# Patient Record
Sex: Female | Born: 1995 | Race: White | Hispanic: No | Marital: Single
Health system: Southern US, Community
[De-identification: ages and names within clinical notes are randomized; demographics above are authoritative.]

## PROBLEM LIST (undated history)

## (undated) DIAGNOSIS — Z6841 Body Mass Index (BMI) 40.0 and over, adult: Secondary | ICD-10-CM

## (undated) DIAGNOSIS — F988 Other specified behavioral and emotional disorders with onset usually occurring in childhood and adolescence: Secondary | ICD-10-CM

## (undated) DIAGNOSIS — F32A Depression, unspecified: Secondary | ICD-10-CM

## (undated) DIAGNOSIS — O139 Gestational [pregnancy-induced] hypertension without significant proteinuria, unspecified trimester: Secondary | ICD-10-CM

## (undated) DIAGNOSIS — E079 Disorder of thyroid, unspecified: Secondary | ICD-10-CM

## (undated) DIAGNOSIS — F329 Major depressive disorder, single episode, unspecified: Secondary | ICD-10-CM

## (undated) HISTORY — DX: Other specified behavioral and emotional disorders with onset usually occurring in childhood and adolescence: F98.8

## (undated) HISTORY — DX: Depression, unspecified: F32.A

## (undated) HISTORY — DX: Gestational (pregnancy-induced) hypertension without significant proteinuria, unspecified trimester: O13.9

## (undated) HISTORY — DX: Body Mass Index (BMI) 40.0 and over, adult: Z684

## (undated) HISTORY — DX: Major depressive disorder, single episode, unspecified: F32.9

## (undated) HISTORY — DX: Disorder of thyroid, unspecified: E07.9

## (undated) HISTORY — PX: EYE SURGERY: SHX253

## (undated) HISTORY — PX: TUMOR REMOVAL: SHX12

---
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
## 2017-01-01 ENCOUNTER — Inpatient Hospital Stay (HOSPITAL_COMMUNITY): Payer: Medicaid Other | Admitting: Anesthesiology

## 2017-01-01 LAB — CBC
HCT: 30.2 % — ABNORMAL LOW (ref 36.0–46.0)
Hemoglobin: 10.1 g/dL — ABNORMAL LOW (ref 12.0–15.0)
MCH: 25.6 pg — ABNORMAL LOW (ref 26.0–34.0)
MCHC: 33.4 g/dL (ref 30.0–36.0)
MCV: 76.5 fL — ABNORMAL LOW (ref 78.0–100.0)
PLATELETS: 315 10*3/uL (ref 150–400)
RBC: 3.95 MIL/uL (ref 3.87–5.11)
RDW: 14.2 % (ref 11.5–15.5)
WBC: 13.7 10*3/uL — AB (ref 4.0–10.5)

## 2017-01-01 LAB — RPR: RPR: NONREACTIVE

## 2017-01-01 MED ORDER — LACTATED RINGERS IV SOLN: 500.0000 mL | Freq: Once | INTRAVENOUS | Status: DC

## 2017-01-01 MED ORDER — EPHEDRINE 5 MG/ML INJ
10.0000 mg | INTRAVENOUS | Status: DC | PRN
  Filled 2017-01-01: qty 4

## 2017-01-01 MED ORDER — OXYTOCIN 40 UNITS IN LACTATED RINGERS INFUSION - SIMPLE MED
1.0000 m[IU]/min | INTRAVENOUS | Status: DC
  Administered 2017-01-01: 2 m[IU]/min via INTRAVENOUS
  Administered 2017-01-02: 28 m[IU]/min via INTRAVENOUS
  Filled 2017-01-01 (×2): qty 1000

## 2017-01-01 MED ORDER — HYDROXYZINE HCL 50 MG PO TABS
50.0000 mg | ORAL_TABLET | Freq: Three times a day (TID) | ORAL | Status: DC | PRN
  Filled 2017-01-01: qty 1

## 2017-01-01 MED ORDER — LABETALOL HCL 5 MG/ML IV SOLN: 20.0000 mg | INTRAVENOUS | Status: DC | PRN

## 2017-01-01 MED ORDER — LIDOCAINE HCL (PF) 1 % IJ SOLN
INTRAMUSCULAR | Status: DC | PRN
  Administered 2017-01-01: 4 mL
  Administered 2017-01-01: 6 mL via EPIDURAL

## 2017-01-01 MED ORDER — FENTANYL 2.5 MCG/ML BUPIVACAINE 1/10 % EPIDURAL INFUSION (WH - ANES)
14.0000 mL/h | INTRAMUSCULAR | Status: DC | PRN
  Administered 2017-01-01 – 2017-01-02 (×5): 14 mL/h via EPIDURAL
  Filled 2017-01-01 (×5): qty 100

## 2017-01-01 MED ORDER — PHENYLEPHRINE 40 MCG/ML (10ML) SYRINGE FOR IV PUSH (FOR BLOOD PRESSURE SUPPORT)
80.0000 ug | PREFILLED_SYRINGE | INTRAVENOUS | Status: DC | PRN
  Filled 2017-01-01: qty 5

## 2017-01-01 MED ORDER — DIPHENHYDRAMINE HCL 50 MG/ML IJ SOLN: 12.5000 mg | INTRAMUSCULAR | Status: DC | PRN

## 2017-01-01 MED ORDER — TERBUTALINE SULFATE 1 MG/ML IJ SOLN
0.2500 mg | Freq: Once | INTRAMUSCULAR | Status: DC | PRN
  Filled 2017-01-01: qty 1

## 2017-01-01 MED ORDER — PHENYLEPHRINE 40 MCG/ML (10ML) SYRINGE FOR IV PUSH (FOR BLOOD PRESSURE SUPPORT)
80.0000 ug | PREFILLED_SYRINGE | INTRAVENOUS | Status: DC | PRN
  Filled 2017-01-01: qty 10
  Filled 2017-01-01: qty 5

## 2017-01-01 MED ORDER — HYDRALAZINE HCL 20 MG/ML IJ SOLN: 10.0000 mg | Freq: Once | INTRAMUSCULAR | Status: DC | PRN

## 2017-01-01 NOTE — Progress Notes (Signed)
Patient ID: Erika Briggs, female   DOB: [birth year], 21 y.o.   MRN: [id number] Doing well, comfortable  Vitals:   01/01/17 0733 01/01/17 0851 01/01/17 1030 01/01/17 1033  BP: (!) 155/98 (!) 145/100 (!) 165/104 (!) 144/97  Pulse: 92 (!) 101 92 84  Resp: 18 18  18   Temp: 98.8 F (37.1 C)     TempSrc: Oral     Weight:      Height:       FHR reactive UCs every 2 min  Dilation: 2 Effacement (%): 60 Cervical Position: Posterior Station: -2 Presentation: Vertex Exam by:: stone rnc  Has foley,will observe for now since she is contracting so much

## 2017-01-01 NOTE — Anesthesia Procedure Notes (Signed)
Epidural Patient location during procedure: OB  Staffing Anesthesiologist: Adrien Shankar  Preanesthetic Checklist Completed: patient identified, site marked, surgical consent, pre-op evaluation, timeout performed, IV checked, risks and benefits discussed and monitors and equipment checked  Epidural Patient position: sitting Prep: site prepped and draped and DuraPrep Patient monitoring: continuous pulse ox and blood pressure Approach: midline Location: L3-L4 Injection technique: LOR air  Needle:  Needle type: Tuohy  Needle gauge: 17 G Needle length: 9 cm and 9 Needle insertion depth: 7 cm Catheter type: closed end flexible Catheter size: 19 Gauge Catheter at skin depth: 14 cm Test dose: negative  Assessment Events: blood not aspirated, injection not painful, no injection resistance, negative IV test and no paresthesia  Additional Notes Dosing of Epidural:  1st dose, through catheter .............................................  Xylocaine 40 mg  2nd dose, through catheter, after waiting 3 minutes.........Xylocaine 60 mg    As each dose occurred, patient was free of IV sx; and patient exhibited no evidence of SA injection.  Patient is more comfortable after epidural dosed. Please see RN's note for documentation of vital signs,and FHR which are stable.  Patient reminded not to try to ambulate with numb legs, and that an RN must be present when she attempts to get up.        

## 2017-01-01 NOTE — Anesthesia Preprocedure Evaluation (Addendum)
Anesthesia Evaluation  Patient identified by MRN, date of birth, ID band Patient awake    Reviewed: Allergy & Precautions, H&P , Patient's Chart, lab work & pertinent test results  Airway Mallampati: III  TM Distance: >3 FB Neck ROM: full    Dental  (+) Teeth Intact   Pulmonary    breath sounds clear to auscultation       Cardiovascular hypertension,  Rhythm:regular Rate:Normal     Neuro/Psych    GI/Hepatic   Endo/Other    Renal/GU      Musculoskeletal   Abdominal   Peds  Hematology   Anesthesia Other Findings   PIH MO    Reproductive/Obstetrics (+) Pregnancy                            Anesthesia Physical Anesthesia Plan  ASA: III  Anesthesia Plan: Epidural   Post-op Pain Management:    Induction:   Airway Management Planned:   Additional Equipment:   Intra-op Plan:   Post-operative Plan:   Informed Consent: I have reviewed the patients History and Physical, chart, labs and discussed the procedure including the risks, benefits and alternatives for the proposed anesthesia with the patient or authorized representative who has indicated his/her understanding and acceptance.   Dental Advisory Given  Plan Discussed with:   Anesthesia Plan Comments: (Labs checked- platelets confirmed with RN in room. Fetal heart tracing, per RN, reported to be stable enough for sitting procedure. Discussed epidural, and patient consents to the procedure:  included risk of possible headache,backache, failed block, allergic reaction, and nerve injury. This patient was asked if she had any questions or concerns before the procedure started.)        Anesthesia Quick Evaluation

## 2017-01-01 NOTE — Progress Notes (Signed)
IOL for GHTN.   Vitals:   01/01/17 2033 01/01/17 2105  BP: (!) 122/55 131/63  Pulse: 86 88  Resp: 18 18  Temp:     Comfortable w/epidural except for one spot that is "crampy".  FHR Cat 1.  Mild ctx q 2-3 minutes. Pitocin @ 12 mu/min.  cx 5/60/-2.  Will continue to increase pitocin until labor is adequate.

## 2017-01-01 NOTE — Progress Notes (Signed)
Patient ID: Erika Briggs, female   DOB: Nov 11, 1995, 21 y.o.   MRN: [id number]  Feeling well; s/p cytotec x 4 doses BP 150/85, other VSS FHR 150s, +accels, no decels Ctx irreg Cx 1/60/-2  IUP@term  gHTN Cx unfavorable  Cervical foley inserted without difficulty; will leave in place until it falls out  Cam HaiSHAW, KIMBERLY  [facility] CenterCNM 01/01/2017 7:39 AM

## 2017-01-01 NOTE — Progress Notes (Signed)
Patient ID: Erika Briggs, female   DOB: 06-17-1996, 21 y.o.   MRN: [id number] Now has epidural  Vitals:   01/01/17 1302 01/01/17 1332 01/01/17 1402 01/01/17 1432  BP: 130/89 121/68 122/84 (!) 105/55  Pulse: 100 77 95 95  Resp: 17  18 18   Temp:      TempSrc:      SpO2:      Weight:      Height:       FHR stable  Dilation: 4 Effacement (%): 60 Cervical Position: Middle Station: -3 Presentation: Vertex Exam by:: stone rnc  Now on Pitocin augmentation

## 2017-01-02 MED ORDER — CEFAZOLIN SODIUM-DEXTROSE 2-4 GM/100ML-% IV SOLN: 2.0000 g | INTRAVENOUS | Status: DC

## 2017-01-02 MED ORDER — CLINDAMYCIN PHOSPHATE 900 MG/50ML IV SOLN
900.0000 mg | Freq: Three times a day (TID) | INTRAVENOUS | Status: DC
  Filled 2017-01-02 (×2): qty 50

## 2017-01-02 MED ORDER — SODIUM CHLORIDE 0.9 % IV SOLN
2.0000 g | Freq: Four times a day (QID) | INTRAVENOUS | Status: DC
  Administered 2017-01-02: 2 g via INTRAVENOUS
  Filled 2017-01-02: qty 2000

## 2017-01-02 MED ORDER — GENTAMICIN SULFATE 40 MG/ML IJ SOLN
5.0000 mg/kg | INTRAVENOUS | Status: DC
  Administered 2017-01-02: 540 mg via INTRAVENOUS
  Filled 2017-01-02: qty 13.5

## 2017-01-02 MED ORDER — SODIUM BICARBONATE 8.4 % IV SOLN
INTRAVENOUS | Status: DC | PRN
  Administered 2017-01-02: 5 mL via EPIDURAL
  Administered 2017-01-02: 3 mL via EPIDURAL

## 2017-01-02 MED ORDER — SODIUM CHLORIDE 0.9 % IV SOLN
2.0000 g | Freq: Four times a day (QID) | INTRAVENOUS | Status: DC
  Filled 2017-01-02: qty 2000

## 2017-01-02 MED ORDER — BUPIVACAINE HCL (PF) 0.25 % IJ SOLN
INTRAMUSCULAR | Status: DC | PRN
  Administered 2017-01-02: 5 mL via EPIDURAL

## 2017-01-02 MED ORDER — SOD CITRATE-CITRIC ACID 500-334 MG/5ML PO SOLN: 30.0000 mL | ORAL | Status: DC

## 2017-01-02 NOTE — Progress Notes (Signed)
Doing well on pitocin, with epidural.  Interval AROM and IUPC placement at 1600 by CNM Shaw.  BP 96/54 FHR: category I; rate 150, +accels, no decels, mod variability Ctx: regular, q422min, MVU 100 SVE at 1730:  5/50/-2  Plan:  TIUP, cat I tracing IOL for gHTN  Continue pitocin Repeat check 2 hrs Making change despite inadequate ctx  Charlsie MerlesJulia Rhoden, MD PGY3 [phone] 01/02/17

## 2017-01-02 NOTE — Progress Notes (Signed)
S:  Patient continues to be comfortable with epidural-  O:  Vitals:   01/02/17 0432 01/02/17 0507  BP: 136/76 116/62  Pulse: 91 83  Resp: 18 16  Temp:  97.3 F (36.3 C)          FHR : baseline 150 / variability mod / accelerations present / no decelerations        Toco: contractions every 2-4 minutes / moderate       Dilation: 5.5 Effacement (%): 60 Cervical Position: Middle Station: -2 Presentation: Vertex  A: IOL for GHTN, early labor- slow progress      FHR category 1  P: Continue on current pit level for 2 hrs- recheck cervix possible pit break if still same      Steward DroneVeronica Rogers BSN, SNM 01/02/2017, 5:09 AM

## 2017-01-02 NOTE — Progress Notes (Signed)
Patient ID: Erika Briggs, female   DOB: 01-Oct-1996, 21 y.o.   MRN: [id number]  Feels well w/ epidural; had Pit break x 2 hrs and restarted @ 1030  BP 149/89, other VSS FHR 140-150s, +accels, no decels Ctx- difficult to trace; Pit @ 804mu/min Cx deferred (was 5/70 @ 1030)  IUP@term  gHTN- stable  Plan to AROM and place IUPC when Pit reaches ~ 6410mu/min  Cam HaiSHAW, KIMBERLY CNM 01/02/2017 11:39 AM

## 2017-01-02 NOTE — Progress Notes (Signed)
No change in cx.  Pit @ 4330mu/min for several hours.  Will give 2 hour pitocin break, restart, consider ROM. FHR Cat !, Ctx irregular still.

## 2017-01-02 NOTE — Progress Notes (Signed)
Complaining of pelvic pressure and rectal pain FHR cat I, rate 150, + accels, no decels, moderate variability Ctx regular, q133min, around 120 MVU SVE 6.5/70/-2  Plan: Will contact anesthesia to try to improve pain management in an attempt to make her more confortable  Making changes but slowly  Adair LaundryAna Carvalho do Amaral MD PGY1 [phone] 01/02/17

## 2017-01-02 NOTE — Progress Notes (Signed)
L&D Note  01/02/2017 - 10:40 PM  21 y.o. G1P0000 [redacted]w[redacted]d. Pregnancy complicated by BMI 45.  [name] is admitted for gHTN at term IOL   Subjective:  Feeling more pain and pressure.   Objective:   Vitals:   01/02/17 2205 01/02/17 2210 01/02/17 2215 01/02/17 2220  BP: (!) 101/58 118/73 127/68 133/76  Pulse: (!) 117 (!) 119 (!) 110 (!) 118  Resp: 18 18 18 18   Temp:  (!) 100.4 F (38 C)    TempSrc:  Axillary    SpO2:      Weight:      Height:        Current Vital Signs 24h Vital Sign Ranges  T (!) 100.4 F (38 C) Temp  Avg: 98.6 F (37 C)  Min: 97.3 F (36.3 C)  Max: 100.5 F (38.1 C)  BP 133/76 BP  Min: 96/54  Max: 160/86  HR (!) 118 Pulse  Avg: 96.8  Min: 70  Max: 185  RR 18 Resp  Avg: 17.3  Min: 16  Max: 20  SaO2 99 %   No Data Recorded       24 Hour I/O Current Shift I/O  Time Ins Outs 03/01 0701 - 03/02 0700 In: -  Out: 1750 [Urine:1750] No intake/output data recorded.   FHR: 165 baseline, +fetal scalp stim, no decels, mod variability Toco: q1-38m Gen: mild distress with UCs SVE: complete/0 to +1 station, minimal caput. Difficult to tell position b/c pt too uncomfortable but doesn't feel OP based on leopolds. Pelvis feels adequate. EFW 3600gm.   Labs:   Recent Labs Lab 12/31/16 1320 01/01/17 1050  WBC 10.4 13.7*  HGB 10.1* 10.1*  HCT 30.1* 30.2*  PLT 334 315    Recent Labs Lab 12/31/16 1320  NA 135  K 4.2  CL 106  CO2 21*  BUN 11  CREATININE 0.47  CALCIUM 8.5*  PROT 6.5  BILITOT 0.5  ALKPHOS 171*  ALT 10*  AST 13*  GLUCOSE 88    Medications Current Facility-Administered Medications  Medication Dose Route Frequency Provider Last Rate Last Dose  . acetaminophen (TYLENOL) tablet 650 mg  650 mg Oral Q4H PRN Lorne Skeens, MD   650 mg at 01/02/17 2116  . [START ON 01/03/2017] ampicillin (OMNIPEN) 2 g in sodium chloride 0.9 % 50 mL IVPB  2 g Intravenous Q6H Hissop Bing, MD      . ceFAZolin (ANCEF) IVPB 2g/100 mL premix  2 g  Intravenous 30 min Pre-Op [place] Bing, MD      . clindamycin (CLEOCIN) IVPB 900 mg  900 mg Intravenous Q8H [place] Bing, MD      . diphenhydrAMINE (BENADRYL) injection 12.5 mg  12.5 mg Intravenous Q15 min PRN Cristela Blue, MD      . ePHEDrine injection 10 mg  10 mg Intravenous PRN Cristela Blue, MD      . ePHEDrine injection 10 mg  10 mg Intravenous PRN Cristela Blue, MD      . fentaNYL (SUBLIMAZE) injection 100 mcg  100 mcg Intravenous Q1H PRN Lorne Skeens, MD      . fentaNYL 2.5 mcg/ml w/bupivacaine 0.1% in NS epidural infusion (WH-ANES)  14 mL/hr Epidural Continuous PRN Cristela Blue, MD 14 mL/hr at 01/02/17 1748 14 mL/hr at 01/02/17 1748  . gentamicin (GARAMYCIN) 540 mg in dextrose 5 % 50 mL IVPB  5 mg/kg Intravenous Q24H Arabella Merles, CNM   540 mg at 01/02/17 2211  . hydrALAZINE (APRESOLINE)  injection 10 mg  10 mg Intravenous Once PRN Aviva SignsMarie L Williams, CNM      . hydrOXYzine (ATARAX/VISTARIL) tablet 50 mg  50 mg Oral TID PRN Arabella MerlesKimberly D Shaw, CNM      . labetalol (NORMODYNE,TRANDATE) injection 20-80 mg  20-80 mg Intravenous Q10 min PRN Aviva SignsMarie L Williams, CNM      . lactated ringers infusion 500 mL  500 mL Intravenous Once Cristela BlueKyle Jackson, MD      . lactated ringers infusion 500-1,000 mL  500-1,000 mL Intravenous PRN Lorne SkeensNicholas Michael Schenk, MD      . lactated ringers infusion   Intravenous Continuous Lorne SkeensNicholas Michael Schenk, MD 125 mL/hr at 01/02/17 2134    . lidocaine (PF) (XYLOCAINE) 1 % injection 30 mL  30 mL Subcutaneous PRN Lorne SkeensNicholas Michael Schenk, MD      . misoprostol (CYTOTEC) tablet 25 mcg  25 mcg Vaginal Q4H PRN Lorne SkeensNicholas Michael Schenk, MD   25 mcg at 01/01/17 0414  . ondansetron (ZOFRAN) injection 4 mg  4 mg Intravenous Q6H PRN Lorne SkeensNicholas Michael Schenk, MD   4 mg at 01/02/17 2006  . oxyCODONE-acetaminophen (PERCOCET/ROXICET) 5-325 MG per tablet 1 tablet  1 tablet Oral Q4H PRN Lorne SkeensNicholas Michael Schenk, MD      . oxyCODONE-acetaminophen (PERCOCET/ROXICET) 5-325 MG per  tablet 2 tablet  2 tablet Oral Q4H PRN Lorne SkeensNicholas Michael Schenk, MD      . oxytocin (PITOCIN) IV BOLUS FROM BAG  500 mL Intravenous Once Lorne SkeensNicholas Michael Schenk, MD      . oxytocin (PITOCIN) IV infusion 40 units in LR 1000 mL - Premix  2.5 Units/hr Intravenous Continuous Lorne SkeensNicholas Michael Schenk, MD      . oxytocin (PITOCIN) IV infusion 40 units in LR 1000 mL - Premix  1-40 milli-units/min Intravenous Titrated Wendee Beaversavid J McMullen, DO 45 mL/hr at 01/02/17 2100 30 milli-units/min at 01/02/17 2100  . PHENYLephrine 40 mcg/ml in normal saline Adult IV Push Syringe  80 mcg Intravenous PRN Cristela BlueKyle Jackson, MD      . PHENYLephrine 40 mcg/ml in normal saline Adult IV Push Syringe  80 mcg Intravenous PRN Cristela BlueKyle Jackson, MD      . sodium citrate-citric acid (ORACIT) solution 30 mL  30 mL Oral Q2H PRN Lorne SkeensNicholas Michael Schenk, MD   30 mL at 01/02/17 1321  . sodium citrate-citric acid (ORACIT) solution 30 mL  30 mL Oral 30 min Pre-Op [place] Bingharlie Aber[place]afen, MD      . terbutaline (BRETHINE) injection 0.25 mg  0.25 mg Subcutaneous Once PRN Lorne SkeensNicholas Michael Schenk, MD      . terbutaline (BRETHINE) injection 0.25 mg  0.25 mg Subcutaneous Once PRN Wendee Beaversavid J McMullen, DO      . zolpidem (AMBIEN) tablet 5 mg  5 mg Oral QHS PRN Arabella MerlesKimberly D Shaw, CNM       Facility-Administered Medications Ordered in Other Encounters  Medication Dose Route Frequency Provider Last Rate Last Dose  . bupivacaine (PF) (MARCAINE) 0.25 % injection    Anesthesia Intra-op Renford DillsJanet L Mullins, CRNA   5 mL at 01/02/17 2201  . lidocaine (PF) (XYLOCAINE) 1 % injection    Anesthesia Intra-op Cristela BlueKyle Jackson, MD   6 mL at 01/01/17 1149  . lidocaine 2 % w/epi 1:200,000 (20 ml) with sod bicarb 8.4 % (2 ml) inj    Continuous PRN Cristela BlueKyle Jackson, MD   5 mL at 01/02/17 2201    Assessment & Plan:  Pt progressed well *IUP: category II tracing but overall reassuring with +scalp stim and moderate variability *  Labor: will start pushing and if able to bring to +3 station, will offer  patient OVD *gHTN: negative pre-x w/u on admission *GBS: neg *Analgesia: working adequately. Will try to hold off on redosing if can push effectively at current dosing.   Cornelia Copa MD Attending Center for [facility])

## 2017-01-02 NOTE — Progress Notes (Signed)
   Erika Briggs is a 21 y.o. G1P0000 at 846w0d  admitted for induction of labor due to Hypertension.  Subjective: COMFORTABLE WITH EPIDURAL  Objective: Vitals:   01/01/17 2335 01/02/17 0000 01/02/17 0035 01/02/17 0100  BP: 133/77 138/86 131/74 132/75  Pulse: 85 88 83 80  Resp: 16 16 16 16   Temp:      TempSrc:      SpO2:      Weight:      Height:       Total I/O In: -  Out: 600 [Urine:600]  FHT:  FHR: 140 bpm, variability: moderate,  accelerations:  Present,  decelerations:  Absent UC:   regular, every 2-3 minutes SVE:   Dilation: 5.5 Effacement (%): 60 Station: -2 Exam by:: Steward DroneVeronica Rogers CNM Student  Pitocin @ 22 mu/min  Labs: Lab Results  Component Value Date   WBC 13.7 (H) 01/01/2017   HGB 10.1 (L) 01/01/2017   HCT 30.2 (L) 01/01/2017   MCV 76.5 (L) 01/01/2017   PLT 315 01/01/2017    Assessment / Plan: IOL for GHTN, slow progress  Labor: not in labor Fetal Wellbeing:  Category I Pain Control:  Epidural Anticipated MOD:  NSVD  CRESENZO-DISHMAN,Lakina Mcintire 01/02/2017, 1:08 AM

## 2017-01-02 NOTE — Progress Notes (Signed)
Pt feeling good with epidural, feeling some pressure during contractions. Now Pit 1412mu/min  BP 160/86, NS VS FHR's 145 - 160s, + accels, no decels, moderate variability  Contractions every 3-784min  SVE: 5/60/-2, unchanged  AROM @14 :15; IUPC placed  Plan: recheck in 2h, verify contraction adequacy, continue to titrate pit  Erika Briggs, Erika Caloca, MD, PGY1 01/02/17; 14:26

## 2017-01-02 NOTE — Progress Notes (Signed)
Patient ID: Erika Briggs, female   DOB: [birth year], 19 y.o.   MRN: [id number]  Feeling more uncomfortable; T 100.5 @ 2100 and has rec'd SamoaGent and Amp and Tylenol  VS: 133/76, P 118, T 100.4 FHR 160s, avg LTV, +SS Ctx Q 2-3 min, difficult to eval MVUs, Pit at 5730mu/min Cx C/C/0 per Dr Vergie LivingPickens  IUP@term  gHTN- stable Triple I End 1st stage  Will begin pushing with ctx and eval progress Consider operative vag del prn  Cam HaiSHAW, Dalina Samara CNM 01/02/2017 10:46 PM

## 2017-01-03 ENCOUNTER — Encounter (HOSPITAL_COMMUNITY): Payer: Self-pay | Admitting: General Practice

## 2017-01-03 DIAGNOSIS — Z3A38 38 weeks gestation of pregnancy: Secondary | ICD-10-CM

## 2017-01-03 DIAGNOSIS — O135 Gestational [pregnancy-induced] hypertension without significant proteinuria, complicating the puerperium: Secondary | ICD-10-CM

## 2017-01-03 LAB — CBC
HCT: 30.3 % — ABNORMAL LOW (ref 36.0–46.0)
HEMOGLOBIN: 10 g/dL — AB (ref 12.0–15.0)
MCH: 25.7 pg — ABNORMAL LOW (ref 26.0–34.0)
MCHC: 33 g/dL (ref 30.0–36.0)
MCV: 77.9 fL — ABNORMAL LOW (ref 78.0–100.0)
Platelets: 276 10*3/uL (ref 150–400)
RBC: 3.89 MIL/uL (ref 3.87–5.11)
RDW: 14.2 % (ref 11.5–15.5)
WBC: 18.7 10*3/uL — ABNORMAL HIGH (ref 4.0–10.5)

## 2017-01-03 MED ORDER — SENNOSIDES-DOCUSATE SODIUM 8.6-50 MG PO TABS
2.0000 | ORAL_TABLET | ORAL | Status: DC
  Administered 2017-01-03 (×2): 2 via ORAL
  Filled 2017-01-03 (×2): qty 2

## 2017-01-03 MED ORDER — IBUPROFEN 600 MG PO TABS
600.0000 mg | ORAL_TABLET | Freq: Four times a day (QID) | ORAL | Status: DC
  Administered 2017-01-03 – 2017-01-04 (×5): 600 mg via ORAL
  Filled 2017-01-03 (×5): qty 1

## 2017-01-03 MED ORDER — AMLODIPINE BESYLATE 5 MG PO TABS
5.0000 mg | ORAL_TABLET | Freq: Every day | ORAL | Status: DC
  Administered 2017-01-03 – 2017-01-04 (×2): 5 mg via ORAL
  Filled 2017-01-03 (×2): qty 1

## 2017-01-03 MED ORDER — ACETAMINOPHEN 325 MG PO TABS
650.0000 mg | ORAL_TABLET | ORAL | Status: DC | PRN
  Administered 2017-01-03 – 2017-01-04 (×2): 650 mg via ORAL
  Filled 2017-01-03 (×2): qty 2

## 2017-01-03 MED ORDER — ZOLPIDEM TARTRATE 5 MG PO TABS: 5.0000 mg | ORAL_TABLET | Freq: Every evening | ORAL | Status: DC | PRN

## 2017-01-03 MED ORDER — COCONUT OIL OIL: 1.0000 "application " | TOPICAL_OIL | Status: DC | PRN

## 2017-01-03 MED ORDER — LABETALOL HCL 5 MG/ML IV SOLN: 20.0000 mg | INTRAVENOUS | Status: DC | PRN

## 2017-01-03 MED ORDER — ONDANSETRON HCL 4 MG/2ML IJ SOLN
4.0000 mg | INTRAMUSCULAR | Status: DC | PRN
Start: 2017-01-03 — End: 2017-01-04

## 2017-01-03 MED ORDER — BENZOCAINE-MENTHOL 20-0.5 % EX AERO
1.0000 "application " | INHALATION_SPRAY | CUTANEOUS | Status: DC | PRN
  Administered 2017-01-03: 1 via TOPICAL
  Filled 2017-01-03: qty 56

## 2017-01-03 MED ORDER — WITCH HAZEL-GLYCERIN EX PADS: 1.0000 "application " | MEDICATED_PAD | CUTANEOUS | Status: DC | PRN

## 2017-01-03 MED ORDER — HYDRALAZINE HCL 20 MG/ML IJ SOLN
5.0000 mg | INTRAMUSCULAR | Status: DC | PRN
  Administered 2017-01-03: 5 mg via INTRAVENOUS
  Filled 2017-01-03: qty 1

## 2017-01-03 MED ORDER — PRENATAL MULTIVITAMIN CH
1.0000 | ORAL_TABLET | Freq: Every day | ORAL | Status: DC
  Administered 2017-01-03: 1 via ORAL
  Filled 2017-01-03: qty 1

## 2017-01-03 MED ORDER — ETONOGESTREL 68 MG ~~LOC~~ IMPL
68.0000 mg | DRUG_IMPLANT | Freq: Once | SUBCUTANEOUS | Status: DC
  Filled 2017-01-03: qty 1

## 2017-01-03 MED ORDER — ONDANSETRON HCL 4 MG PO TABS: 4.0000 mg | ORAL_TABLET | ORAL | Status: DC | PRN

## 2017-01-03 MED ORDER — SIMETHICONE 80 MG PO CHEW
80.0000 mg | CHEWABLE_TABLET | ORAL | Status: DC | PRN
  Administered 2017-01-03 – 2017-01-04 (×2): 80 mg via ORAL
  Filled 2017-01-03 (×2): qty 1

## 2017-01-03 MED ORDER — ONDANSETRON 8 MG PO TBDP
8.0000 mg | ORAL_TABLET | Freq: Once | ORAL | Status: AC
  Administered 2017-01-03: 8 mg via ORAL
  Filled 2017-01-03: qty 1

## 2017-01-03 MED ORDER — OXYCODONE HCL 5 MG PO TABS
5.0000 mg | ORAL_TABLET | ORAL | Status: DC | PRN
  Administered 2017-01-03 – 2017-01-04 (×2): 5 mg via ORAL
  Filled 2017-01-03 (×2): qty 1

## 2017-01-03 MED ORDER — DIPHENHYDRAMINE HCL 25 MG PO CAPS: 25.0000 mg | ORAL_CAPSULE | Freq: Four times a day (QID) | ORAL | Status: DC | PRN

## 2017-01-03 MED ORDER — TETANUS-DIPHTH-ACELL PERTUSSIS 5-2.5-18.5 LF-MCG/0.5 IM SUSP: 0.5000 mL | Freq: Once | INTRAMUSCULAR | Status: DC

## 2017-01-03 MED ORDER — DIBUCAINE 1 % RE OINT: 1.0000 "application " | TOPICAL_OINTMENT | RECTAL | Status: DC | PRN

## 2017-01-03 MED ORDER — LIDOCAINE HCL 1 % IJ SOLN
0.0000 mL | Freq: Once | INTRAMUSCULAR | Status: DC | PRN
  Filled 2017-01-03: qty 20

## 2017-01-03 NOTE — Anesthesia Postprocedure Evaluation (Signed)
Anesthesia Post Note  Patient: Erika Briggs  Procedure(s) Performed: * No procedures listed *  Patient location during evaluation: Mother Baby Anesthesia Type: Epidural Level of consciousness: awake Pain management: satisfactory to patient Vital Signs Assessment: post-procedure vital signs reviewed and stable Respiratory status: spontaneous breathing Cardiovascular status: stable Anesthetic complications: no        Last Vitals:  Vitals:   01/03/17 0250 01/03/17 0640  BP: (!) 153/94 124/61  Pulse: 95 89  Resp: 18 18  Temp: 37.2 C 36.9 C    Last Pain:  Vitals:   01/03/17 0800  TempSrc:   PainSc: 5    Pain Goal: Patients Stated Pain Goal: 8 (01/01/17 0657)               Cephus ShellingBURGER,Tedric Leeth

## 2017-01-03 NOTE — Progress Notes (Signed)
Pt declined Nexplanon birth control.[place] Katrinka BlazingSmith CNM notified.

## 2017-01-03 NOTE — Progress Notes (Signed)
20yo female G1P1001 s/p  PPD#1 SVD@2330   S: Pt complaining of abd pain after bending over to take picture of her baby. Has voided recently. Now anuseated  O: BP have been a bit elevated.  Minimum bleeding.  Uterus seems slightly displaced, superiorly. Firm. Bladder scan: 550cc  A: Likely urinary retention.  Try to void spontaneously on bedpan and, if not possible, in/out cath. Zofran 8mg  ODT Percocet PO  Adair LaundryAna Carvalho do Amaral, MD PGY1 [facility] Fam Med Res Prog Hvl  Pt seen and evaluated with CNM Dorathy KinsmanVirginia Smith

## 2017-01-03 NOTE — Progress Notes (Signed)
Pt bladder scan. Reading 550. Pt IN and out cath for 500 using sterile technique. Pt instructed to try to void at least every 2 hours. Pain rating 5 at this time. Pain med given;

## 2017-01-03 NOTE — Progress Notes (Signed)
Called to rm Pt states in pain rate 10. Pt back to bed. Reassessed pt. MD notified.

## 2017-01-03 NOTE — Clinical Social Work Maternal (Signed)
  CLINICAL SOCIAL WORK MATERNAL/CHILD NOTE  Patient Details  Name: Erika Briggs MRN: [id number] Date of Birth: [birth year]  Date:  01/03/2017  Clinical Social Worker Initiating Note:  Ferdinand Lango Kelden Lavallee, MSW, LCSW-A  Date/ Time Initiated:  01/03/17/1351     Child's Name:  Wells Guiles    Legal Guardian:  Mother   Need for Interpreter:  None   Date of Referral:  01/02/17     Reason for Referral:  Current Substance Use/Substance Use During Pregnancy    Referral Source:  [facility]   Address:  [place]  Phone number:  [phone]   Household Members:  Self, Parents   Natural Supports (not living in the home):  Friends, Extended Family, Medical laboratory scientific officer, Immediate Family   Professional Supports: None   Employment:     Type of Work: Unknown    Education:      Pensions consultant:  [employer]   Other Resources:  [facility]   Cultural/Religious Considerations Which May Impact Care:  [place] per Presenter, broadcasting.   Strengths:  Ability to meet basic needs , Compliance with medical plan , Home prepared for child , Pediatrician chosen    Risk Factors/Current Problems:  Substance Use    Cognitive State:  Alert , Able to Concentrate , Insightful , Goal Oriented    Mood/Affect:  Comfortable , Calm , Interested    CSW Assessment: CSW met with MOB at bedside to complete assessment for consult regarding hx of substance use and new inexperienced MOB. Upon this writers arrival, MOB was holding baby sitting on couch talking with her mom. With MOB's permission, this writer continued assessment by explaining role and reasoning for visit. At this time, MOB mom stepped out of the room. MOB notes she has used substance in the past but has not used during pregnancy. MOB notes she has no concern for baby's UDS and CDS results. This Probation officer educated MOB on the hospitals policy and procedure regarding substance and mandatory report making for (+) results. MOB verbalized  understanding.   This Probation officer assessed MOB for psycho-social needs and support. MOB noted she feels very supported by her parents and older siblings that have children. She notes she is set up with Medicaid and [place] through [facility] to assist in her care of baby. This Probation officer educated MOB on PPD and safe sleeping/SIDS. MOB verbalized understanding and was able to perform teach back. MOB denies the need for additional referrals at this time but if anything changes will contact CSW during hospital stay. At this time, no other needs addressed or requested. Case closed to this CSW.   CSW Plan/Description:  Patient/Family Education , No Further Intervention Required/No Barriers to Discharge, Other (Comment) (CSW will continue to follow pending UDS and CDS. If positive a report will be made to [facility] DSS-CPS)    Oda Cogan, MSW, [facility]  Office: [phone]

## 2017-01-03 NOTE — Progress Notes (Signed)
POSTPARTUM PROGRESS NOTE  Post Partum Day 1 Subjective:  Erika Briggs is a 21 y.o. G1P1001 6766w0d s/p SVD.  No acute events overnight.  Pt denies problems with ambulating, voiding or po intake.  She denies nausea or vomiting.  Pain is well controlled.  She has not had flatus. She has not had bowel movement.  Lochia Small.   Objective: Blood pressure 124/61, pulse 89, temperature 98.4 F (36.9 C), temperature source Oral, resp. rate 18, height 5\' 1"  (1.549 m), weight 237 lb (107.5 kg), last menstrual period 04/11/2016, SpO2 99 %, unknown if currently breastfeeding.  Physical Exam:  General: alert, cooperative and no distress Lochia:normal flow Chest: no respiratory distress Heart:regular rate, distal pulses intact Abdomen: soft, nontender,  Uterine Fundus: firm, appropriately tender DVT Evaluation: No calf swelling or tenderness Extremities: no edema   Recent Labs  01/01/17 1050 01/03/17 0023  HGB 10.1* 10.0*  HCT 30.2* 30.3*    Assessment/Plan:  ASSESSMENT: Erika Briggs is a 21 y.o. G1P1001 566w0d s/p SVD  Plan for discharge tomorrow   LOS: 3 days   Erika Settlena Carvalho do AmaralMD 01/03/2017, 7:55 AM

## 2017-01-04 MED ORDER — IBUPROFEN 600 MG PO TABS: 600.0000 mg | ORAL_TABLET | Freq: Four times a day (QID) | ORAL | 0 refills | Status: DC

## 2017-01-04 NOTE — Discharge Summary (Signed)
OB Discharge Summary  Patient Name: Erika Briggs DOB: 21-Oct-1996 MRN: [id number]  Date of admission: 12/31/2016 Delivering MD: Sharyon Cable   Date of discharge: 01/04/2017  Admitting diagnosis: 37WKS, BP Intrauterine pregnancy: [redacted]w[redacted]d     Secondary diagnosis:Active Problems:   Gestational hypertension  Additional problems:none     Discharge diagnosis: Term Pregnancy Delivered                                                                     Post partum procedures:n/a  Augmentation: n/a  Complications: None  Hospital course:  Onset of Labor With Vaginal Delivery     21 y.o. yo G1P1001 at [redacted]w[redacted]d was admitted in Active Labor on 12/31/2016. Patient had an uncomplicated labor course as follows:  Membrane Rupture Time/Date: 1:55 PM ,01/02/2017   Intrapartum Procedures: Episiotomy: None [1]                                         Lacerations:  2nd degree [3]  Patient had a delivery of a Viable infant. 01/02/2017  Information for the patient's newborn:  Aaminah, Forrester [[id number]]  Delivery Method: Vaginal, Spontaneous Delivery (Filed from Delivery Summary)    Pateint had an uncomplicated postpartum course.  She is ambulating, tolerating a regular diet, passing flatus, and urinating well. Patient is discharged home in stable condition on 01/04/17.   Physical exam  Vitals:   01/03/17 1945 01/03/17 2057 01/04/17 0549 01/04/17 1100  BP: 140/82 (!) 145/93 114/70 139/78  Pulse: 98 85 79   Resp:   18   Temp:   98.9 F (37.2 C)   TempSrc:   Oral   SpO2:      Weight:      Height:       General: alert, cooperative and no distress Lochia: appropriate Uterine Fundus: firm Incision: N/A DVT Evaluation: No evidence of DVT seen on physical exam. Labs: Lab Results  Component Value Date   WBC 18.7 (H) 01/03/2017   HGB 10.0 (L) 01/03/2017   HCT 30.3 (L) 01/03/2017   MCV 77.9 (L) 01/03/2017   PLT 276 01/03/2017   CMP Latest Ref Rng & Units 12/31/2016  Glucose 65 - 99  mg/dL 88  BUN 6 - 20 mg/dL 11  Creatinine 5.62 - 1.30 mg/dL 8.65  Sodium 784 - 696 mmol/L 135  Potassium 3.5 - 5.1 mmol/L 4.2  Chloride 101 - 111 mmol/L 106  CO2 22 - 32 mmol/L 21(L)  Calcium 8.9 - 10.3 mg/dL 2.9(B)  Total Protein 6.5 - 8.1 g/dL 6.5  Total Bilirubin 0.3 - 1.2 mg/dL 0.5  Alkaline Phos 38 - 126 U/L 171(H)  AST 15 - 41 U/L 13(L)  ALT 14 - 54 U/L 10(L)    Discharge instruction: per After Visit Summary and "Baby and Me Booklet".  After Visit Meds:  Allergies as of 01/04/2017   No Known Allergies     Medication List    TAKE these medications   acetaminophen 500 MG tablet Commonly known as:  TYLENOL Take 1,000 mg by mouth every 4 (four) hours as needed. For fever, body aches   ferrous  sulfate 325 (65 FE) MG tablet Commonly known as:  FERROUSUL Take 1 tablet (325 mg total) by mouth 2 (two) times daily.   ibuprofen 600 MG tablet Commonly known as:  ADVIL,MOTRIN Take 1 tablet (600 mg total) by mouth every 6 (six) hours.   multivitamin-prenatal 27-0.8 MG Tabs tablet Take 1 tablet by mouth daily at 12 noon.   ranitidine 150 MG capsule Commonly known as:  ZANTAC Take 150 mg by mouth as needed for heartburn.       Diet: routine diet  Activity: Advance as tolerated. Pelvic rest for 6 weeks.   Outpatient follow up:6 weeks Follow up Appt:No future appointments. Follow up visit: No Follow-up on file.  Postpartum contraception: Combination OCPs  Newborn Data: Live born female  Birth Weight: 7 lb 3 oz (3260 g) APGAR: 6, 8  Baby Feeding: Bottle Disposition:home with mother   01/04/2017 Wyvonnia DuskyMarie Azora Bonzo, CNM

## 2017-01-04 NOTE — Progress Notes (Signed)
Called resident on call (Dr. Claudie ReveringN Schenk) to get clarification on followup bladder scan on patient (no order as of 2355). New order will be placed for  scan after a void.  Jtwells, rn

## 2017-01-07 ENCOUNTER — Encounter: Payer: Self-pay | Admitting: Family Medicine

## 2017-02-13 ENCOUNTER — Ambulatory Visit: Payer: Self-pay | Admitting: Obstetrics and Gynecology

## 2017-02-27 ENCOUNTER — Encounter: Payer: Self-pay | Admitting: Obstetrics and Gynecology

## 2017-02-27 ENCOUNTER — Ambulatory Visit (INDEPENDENT_AMBULATORY_CARE_PROVIDER_SITE_OTHER): Payer: Medicaid Other | Admitting: Obstetrics and Gynecology

## 2017-02-27 DIAGNOSIS — Z6841 Body Mass Index (BMI) 40.0 and over, adult: Secondary | ICD-10-CM

## 2017-02-27 DIAGNOSIS — Z3202 Encounter for pregnancy test, result negative: Secondary | ICD-10-CM | POA: Diagnosis not present

## 2017-02-27 DIAGNOSIS — Z3049 Encounter for surveillance of other contraceptives: Secondary | ICD-10-CM | POA: Diagnosis not present

## 2017-02-27 LAB — POCT URINE PREGNANCY: PREG TEST UR: NEGATIVE

## 2017-02-27 MED ORDER — ETONOGESTREL 68 MG ~~LOC~~ IMPL
68.0000 mg | DRUG_IMPLANT | Freq: Once | SUBCUTANEOUS | Status: AC
  Administered 2017-02-27: 68 mg via SUBCUTANEOUS

## 2017-02-27 NOTE — Progress Notes (Signed)
Post Partum Exam  Erika Briggs is a 21 y.o. G47P1001 female who presents for a postpartum visit. She is 8 weeks postpartum following a spontaneous vaginal delivery. I have fully reviewed the prenatal and intrapartum course. The delivery was at [redacted]w[redacted]d gestational weeks.  Anesthesia: epidural. Postpartum course has been unremarkable. Baby's course has been unremarkable. Baby is feeding by bottle - Similac Advance. Bleeding no bleeding. Bowel function is normal. Bladder function is normal. Patient is not sexually active. Contraception method is Nexplanon. Postpartum depression screening: Score = 8

## 2017-02-27 NOTE — Progress Notes (Signed)
Obstetrics Visit Postpartum Visit  Appointment Date: 02/28/2017  OBGYN Clinic: [facility]  Primary Care Provider: None  Chief Complaint: Postpartum visit  History of Present Illness: Erika Briggs is a 21 y.o. Caucasian G1P1001 (LMP: none), seen for the above chief complaint. Her past medical history is significant for teen pregnancy, BMI 41, gHTN  She is s/p SVD/2nd degree on 3/3 at term; she was discharged to home on PPD#1.  She states that she's doing well and has good help at home and is adjusting well to the new infant.   Vaginal bleeding or discharge: No  Breast or formula feeding: breast Intercourse: No  Contraception after delivery: No  PP depression s/s: No  Any bowel or bladder issues: No  Pap smear: n/a due to age  Review of Systems:  as noted in the History of Present Illness.  Medications PNV  Allergies Patient has no known allergies.  Physical Exam:  BP 106/74   Pulse 74   Wt 217 lb (98.4 kg)   Breastfeeding? No   BMI 41.00 kg/m  Body mass index is 41 kg/m. General appearance: Well nourished, well developed female in no acute distress.  Abdomen: soft, obese, nttp,nd Neuro/Psych:  Normal mood and affect.  Skin:  Warm and dry.   See procedure note for uncomplicated Nexplanon insertion in LUE  Laboratory: UPT neg  PP Depression Screening:  8, #10 zero  Assessment: Pt doing well  Plan: pt doing well  Routine care. Possible AUB, amenorrheic, possible bleeding patterns with nexplanon d/w her  RTC PRN  Cornelia Copa MD Attending [facility])

## 2017-02-27 NOTE — Procedures (Signed)
Nexplanon Insertion Procedure Note Prior to the procedure being performed, the patient was asked to state their full name, date of birth, type of procedure being performed and the exact location of the operative site. This information was then checked against the documentation in the patient's chart. Prior to the procedure being performed, a "time out" was performed by the physician that confirmed the correct patient, procedure and site.  After informed consent was obtained, the patient's non-dominant left arm was chosen for insertion. A site was marked approximately 8 cm proximal to the medial epicondyle in the sulcus between the biceps and triceps on the inner surface. The area was cleaned with alcohol then local anesthesia was infiltrated with 3 ml of 1% lidocaine with epinephrine along the planned insertion track. The area was prepped with betadine. Using sterile technique the Nexplanon device was inserted per manufacturer's guidelines in the subdermal connective tissue using the standard insertion technique without difficulty. Pressure was applied and the insertion site was hemostatic. The presence of the Nexplanon was confirmed immediately after insertion by palpation by both me and the patient and by checking the tip of needle for the absence of the insert.  A pressure dressing was applied.  The patient tolerated the procedure well.  Cornelia Copa MD Attending [facility])

## 2017-02-28 ENCOUNTER — Encounter: Payer: Self-pay | Admitting: Obstetrics and Gynecology

## 2017-02-28 DIAGNOSIS — Z6841 Body Mass Index (BMI) 40.0 and over, adult: Secondary | ICD-10-CM | POA: Insufficient documentation

## 2017-05-22 ENCOUNTER — Encounter (HOSPITAL_COMMUNITY): Payer: Self-pay

## 2017-05-22 ENCOUNTER — Emergency Department (HOSPITAL_COMMUNITY)
Admission: EM | Admit: 2017-05-22 | Discharge: 2017-05-22 | Disposition: A | Discharge status: Home / Self Care | Payer: Medicaid Other | Attending: Emergency Medicine | Admitting: Emergency Medicine

## 2017-05-22 DIAGNOSIS — Z79899 Other long term (current) drug therapy: Secondary | ICD-10-CM | POA: Insufficient documentation

## 2017-05-22 DIAGNOSIS — G43009 Migraine without aura, not intractable, without status migrainosus: Secondary | ICD-10-CM | POA: Insufficient documentation

## 2017-05-22 DIAGNOSIS — Z7722 Contact with and (suspected) exposure to environmental tobacco smoke (acute) (chronic): Secondary | ICD-10-CM | POA: Insufficient documentation

## 2017-05-22 DIAGNOSIS — R51 Headache: Secondary | ICD-10-CM | POA: Diagnosis present

## 2017-05-22 MED ORDER — KETOROLAC TROMETHAMINE 15 MG/ML IJ SOLN
15.0000 mg | Freq: Once | INTRAMUSCULAR | Status: AC
  Administered 2017-05-22: 15 mg via INTRAVENOUS
  Filled 2017-05-22: qty 1

## 2017-05-22 MED ORDER — SODIUM CHLORIDE 0.9 % IV BOLUS (SEPSIS)
1000.0000 mL | Freq: Once | INTRAVENOUS | Status: AC
  Administered 2017-05-22: 1000 mL via INTRAVENOUS

## 2017-05-22 MED ORDER — METOCLOPRAMIDE HCL 5 MG/ML IJ SOLN
10.0000 mg | Freq: Once | INTRAMUSCULAR | Status: AC
Start: 2017-05-22 — End: 2017-05-22
  Administered 2017-05-22: 10 mg via INTRAVENOUS
  Filled 2017-05-22: qty 2

## 2017-05-22 MED ORDER — DIPHENHYDRAMINE HCL 50 MG/ML IJ SOLN
25.0000 mg | Freq: Once | INTRAMUSCULAR | Status: AC
  Administered 2017-05-22: 25 mg via INTRAVENOUS
  Filled 2017-05-22: qty 1

## 2017-05-22 NOTE — ED Provider Notes (Signed)
[facility]-EMERGENCY DEPT Provider Note   CSN: [id number] Arrival date & time: 05/22/17  1252     History   Chief Complaint Chief Complaint  Patient presents with  . Headache    HPI Erika Briggs is a 21 y.o. female who is 4 months postpartum with a pregnancy, treated by gestational hypertension, not preeclampsia, is coming in today with the dental head pain on the right side. She says it's not really a headache but describes it as pain and side of her head that is dull and located on the right side and will occasionally become frontal. Does endorse sinus issues as well. No acute vision changes but does say since her pregnancy she has had "things moving in her peripheral vision", does endorse some nausea but has had no vomiting. Started 3 days ago and has been consistent ever since. Tried Tylenol and is unaware if it helped. No chest pain, shortness of breath. No leg swelling, seizures, altered mental status. Normal urine output.  HPI  Past Medical History:  Diagnosis Date  . ADD (attention deficit disorder)   . BMI 40.0-44.9, adult (HCC)   . Gestational hypertension 12/31/2016    Patient Active Problem List   Diagnosis Date Noted  . BMI 40.0-44.9, adult [facility](HCC)     Past Surgical History:  Procedure Laterality Date  . EYE SURGERY      OB History    Gravida Para Term Preterm AB Living   2 1 1  0 0 1   SAB TAB Ectopic Multiple Live Births   0 0 0 0 1       Home Medications    Prior to Admission medications   Medication Sig Start Date End Date Taking? Authorizing Provider  acetaminophen (TYLENOL) 500 MG tablet Take 1,000 mg by mouth every 4 (four) hours as needed. For fever, body aches   Yes [provider]  Etonogestrel (NEXPLANON [place]) Inject 1 each into the skin once.   Yes [provider]  ibuprofen (ADVIL,MOTRIN) 600 MG tablet Take 1 tablet (600 mg total) by mouth every 6 (six) hours. 01/04/17  Yes Montez MoritaLawson, Marie D, CNM  ferrous sulfate (FERROUSUL) 325 (65  FE) MG tablet Take 1 tablet (325 mg total) by mouth 2 (two) times daily. Patient not taking: Reported on 05/22/2017 12/09/16   Anyanwu, Jethro BastosUgonna A, MD  Prenatal Vit-Fe Fumarate-FA (MULTIVITAMIN-PRENATAL) 27-0.8 MG TABS tablet Take 1 tablet by mouth daily at 12 noon.    [provider]  ranitidine (ZANTAC) 150 MG capsule Take 150 mg by mouth as needed for heartburn.    [provider]    Family History Family History  Problem Relation Age of Onset  . Heart failure Other   . Hypertension Mother   . ADD / ADHD Mother   . ADD / ADHD Sister   . ADD / ADHD Brother   . Diabetes Maternal Grandmother     Social History Social History  Substance Use Topics  . Smoking status: Passive Smoke Exposure - Never Smoker  . Smokeless tobacco: Never Used  . Alcohol use No     Allergies   Patient has no known allergies.   Review of Systems Review of Systems  Constitutional: Negative for chills and fever.  HENT: Positive for sinus pain and sinus pressure. Negative for ear pain, sore throat and trouble swallowing.   Eyes: Positive for visual disturbance. Negative for photophobia, pain and discharge.  Respiratory: Negative for cough and shortness of breath.   Cardiovascular: Negative  for chest pain and palpitations.  Gastrointestinal: Negative for abdominal pain and vomiting.  Genitourinary: Negative for dysuria and hematuria.  Musculoskeletal: Negative for arthralgias and back pain.  Skin: Negative for color change and rash.  Neurological: Positive for headaches. Negative for seizures and syncope.  All other systems reviewed and are negative.    Physical Exam Updated Vital Signs BP 132/89 (BP Location: Left Arm)   Pulse 81   Temp 98.3 F (36.8 C) (Oral)   Resp 18   Ht 5\' 1"  (1.549 m)   Wt 90.7 kg (200 lb)   LMP 05/08/2017 (Approximate)   SpO2 100%   BMI 37.79 kg/m   Physical Exam  Constitutional: She is oriented to person, place, and time. She appears well-developed  and well-nourished. No distress.  HENT:  Head: Normocephalic and atraumatic.  Eyes: Pupils are equal, round, and reactive to light. Conjunctivae and EOM are normal.  Neck: Normal range of motion. Neck supple. No tracheal deviation present.  Cardiovascular: Normal rate and regular rhythm.   No murmur heard. Pulmonary/Chest: Effort normal and breath sounds normal. No respiratory distress.  Abdominal: Soft. She exhibits no distension. There is no tenderness. There is no rebound and no guarding.  Musculoskeletal: She exhibits no edema or tenderness.  Neurological: She is alert and oriented to person, place, and time. She displays normal reflexes. No cranial nerve deficit or sensory deficit. She exhibits normal muscle tone. Coordination normal.  Unremarkable Neurological exam. Cranial nerves II through XII are intact. Normal finger-nose. 5/5 strength in both upper and lower extremities. Sensation is intact throughout. Normal finger to nose. Able to ambulate.    Skin: Skin is warm and dry. She is not diaphoretic.  Psychiatric: She has a normal mood and affect.  Nursing note and vitals reviewed.    ED Treatments / Results  Labs (all labs ordered are listed, but only abnormal results are displayed) Labs Reviewed - No data to display  EKG  EKG Interpretation None       Radiology No results found.  Procedures Procedures (including critical care time)  Medications Ordered in ED Medications  sodium chloride 0.9 % bolus 1,000 mL (1,000 mLs Intravenous New Bag/Given 05/22/17 1718)  diphenhydrAMINE (BENADRYL) injection 25 mg (25 mg Intravenous Given 05/22/17 1724)  metoCLOPramide (REGLAN) injection 10 mg (10 mg Intravenous Given 05/22/17 1722)  ketorolac (TORADOL) 15 MG/ML injection 15 mg (15 mg Intravenous Given 05/22/17 1720)     Initial Impression / Assessment and Plan / ED Course  I have reviewed the triage vital signs and the nursing notes.  Pertinent labs & imaging results that  were available during my care of the patient were reviewed by me and considered in my medical decision making (see chart for details).     Patient presents with 3 days of right sided and frontal headache. Not acute in onset and gradual. Not worse in the morning. Some nausea but no vomiting. No trauma or other inciting events. Unremarkable neurological exam. Unlikely intracranial hemorrhage, Mass, other acute intracranial emergency requiring further lab work, imaging. Patient with familial history of migraines in the family. Given migraine cocktail and bolus of fluid.  Upon recheck around 5:50 PM, patient sleeping in bed in no apparent distress. Upon awakening at 6:10 PM, she said her headache was much improved and was ready to go home. Likely migraine headache. Told to follow-up with her primary care physician in the next few days for recheck and possible neurology referral if migraine recurs. She voiced understanding  and agreement to the plan and was comfortable with outpatient management.  Patient was seen with my attending, Dr. Jodi Mourning, who voiced agreement and oversaw the evaluation and treatment of this patient.   Dr[employer] illustrator was used in the creation of this note. If there are any errors or inconsistencies needing clarification, please contact me directly.   Final Clinical Impressions(s) / ED Diagnoses   Final diagnoses:  Migraine without aura and without status migrainosus, not intractable    New Prescriptions New Prescriptions   No medications on file     Orson Slick, MD 05/22/17 1814    Blane Ohara, MD 05/23/17 [phone]

## 2017-05-22 NOTE — ED Triage Notes (Addendum)
Pt presents with 3 day h/o R temporal headache.  Pt reports pain is intermittent, reports nausea, denies photophobia. Pt also reports "things moving" to both peripheral vision fields that is intermittent, reports are light and dark, reports vision change x 4 months since she gave birth.

## 2017-05-22 NOTE — ED Notes (Signed)
When asked about her headache pt st's "it's not really a headache".  St's it's just a pain that comes and goes for past 3 days.  Pt st's she has taken Tylenol without relief.  Pt alert and oriented x's 3.

## 2017-05-22 NOTE — ED Notes (Signed)
Pt ambulated to lobby with family member. No questions for RN at discharge.

## 2017-05-23 ENCOUNTER — Encounter (HOSPITAL_COMMUNITY): Payer: Self-pay | Admitting: Emergency Medicine

## 2017-05-23 ENCOUNTER — Emergency Department (HOSPITAL_COMMUNITY): Payer: Medicaid Other

## 2017-05-23 ENCOUNTER — Emergency Department (HOSPITAL_COMMUNITY)
Admission: EM | Admit: 2017-05-23 | Discharge: 2017-05-23 | Disposition: A | Discharge status: Home / Self Care | Payer: Medicaid Other | Attending: Emergency Medicine | Admitting: Emergency Medicine

## 2017-05-23 DIAGNOSIS — R51 Headache: Secondary | ICD-10-CM | POA: Insufficient documentation

## 2017-05-23 DIAGNOSIS — R519 Headache, unspecified: Secondary | ICD-10-CM

## 2017-05-23 DIAGNOSIS — H538 Other visual disturbances: Secondary | ICD-10-CM | POA: Diagnosis not present

## 2017-05-23 DIAGNOSIS — R110 Nausea: Secondary | ICD-10-CM | POA: Diagnosis not present

## 2017-05-23 DIAGNOSIS — M542 Cervicalgia: Secondary | ICD-10-CM | POA: Diagnosis not present

## 2017-05-23 DIAGNOSIS — M546 Pain in thoracic spine: Secondary | ICD-10-CM | POA: Insufficient documentation

## 2017-05-23 LAB — POC URINE PREG, ED: Preg Test, Ur: NEGATIVE

## 2017-05-23 NOTE — ED Triage Notes (Signed)
Pt comes in with complaints of a dull ache on the right side of her head that has been intermittent for the past 3-4 days.  Pt states she is not here for the pain but wanted a head CT to see what is wrong with her. Reports anxiety due to [facility] having [facility]

## 2017-05-23 NOTE — ED Provider Notes (Signed)
[facility]-EMERGENCY DEPT Provider Note   CSN: [id number] Arrival date & time: 05/23/17  1307  By signing my name below, I, Thelma Barge, attest that this documentation has been prepared under the direction and in the presence of Glenford Bayley, PA-C. Electronically Signed: Thelma Barge, Scribe. 05/23/17. 2:23 PM.  History   Chief Complaint No chief complaint on file.  The history is provided by the patient. No language interpreter was used.    HPI Comments: Erika Briggs is a 21 y.o. female with PMHx of anxiety who presents to the Emergency Department complaining of waxing/waning, dull right-sided headache for 4 days. She has associated visual disturbances (things (dots or lines) moving in her peripheral vision, but this is a chronic issue for her since she was pregnant) nausea, neck and upper back pain. She was [facility] yesterday and expresses unhappiness and anxiety over not receiving a CT scan because "they were only treating the pain, but the pain isn't the problem" and she states she "just doesn't feel normal" and is worried something is wrong because she has never gotten this sensation before. She has tried a migraine cocktail for her headache and icy hot for her upper back pain with mild relief. She denies congestion, vomiting, sore throat, fever, cough, numbness/tingling beyond baseline. She is 4 months postpartum.  Pt has no PCP.  Past Medical History:  Diagnosis Date  . ADD (attention deficit disorder)   . BMI 40.0-44.9, adult (HCC)   . Gestational hypertension 12/31/2016    Patient Active Problem List   Diagnosis Date Noted  . BMI 40.0-44.9, adult [facility])     Past Surgical History:  Procedure Laterality Date  . EYE SURGERY      OB History    Gravida Para Term Preterm AB Living   2 1 1  0 0 1   SAB TAB Ectopic Multiple Live Births   0 0 0 0 1       Home Medications    Prior to Admission medications   Medication Sig Start Date End Date Taking? Authorizing Provider    acetaminophen (TYLENOL) 500 MG tablet Take 1,000 mg by mouth every 4 (four) hours as needed. For fever, body aches    [provider]  Etonogestrel (NEXPLANON [place]) Inject 1 each into the skin once.    [provider]  ferrous sulfate (FERROUSUL) 325 (65 FE) MG tablet Take 1 tablet (325 mg total) by mouth 2 (two) times daily. Patient not taking: Reported on 05/22/2017 12/09/16   Tereso Newcomer, MD  ibuprofen (ADVIL,MOTRIN) 600 MG tablet Take 1 tablet (600 mg total) by mouth every 6 (six) hours. 01/04/17   Montez Morita, CNM  Prenatal Vit-Fe Fumarate-FA (MULTIVITAMIN-PRENATAL) 27-0.8 MG TABS tablet Take 1 tablet by mouth daily at 12 noon.    [provider]  ranitidine (ZANTAC) 150 MG capsule Take 150 mg by mouth as needed for heartburn.    [provider]    Family History Family History  Problem Relation Age of Onset  . Heart failure Other   . Hypertension Mother   . ADD / ADHD Mother   . ADD / ADHD Sister   . ADD / ADHD Brother   . Diabetes Maternal Grandmother     Social History Social History  Substance Use Topics  . Smoking status: Passive Smoke Exposure - Never Smoker  . Smokeless tobacco: Never Used  . Alcohol use No     Allergies   Patient has no known  allergies.   Review of Systems Review of Systems  Constitutional: Negative for fever.  HENT: Negative for congestion and sore throat.   Eyes: Positive for visual disturbance.  Respiratory: Negative for cough.   Gastrointestinal: Positive for nausea. Negative for vomiting.  Musculoskeletal: Positive for back pain and neck pain.  Neurological: Negative for numbness.  Psychiatric/Behavioral: The patient is nervous/anxious.      Physical Exam Updated Vital Signs BP (!) 139/92 (BP Location: Left Arm)   Pulse 74   Temp 97.8 F (36.6 C) (Oral)   Resp 16   Ht 5\' 1"  (1.549 m)   Wt 90.7 kg (200 lb)   LMP 05/08/2017 (Approximate)   SpO2 99%   BMI 37.79 kg/m   Physical Exam   Constitutional: She appears well-developed and well-nourished. No distress.  HENT:  Head: Normocephalic and atraumatic.  Mouth/Throat: Oropharynx is clear and moist. No oropharyngeal exudate.  Eyes: Pupils are equal, round, and reactive to light. Conjunctivae and EOM are normal. Right eye exhibits no discharge. Left eye exhibits no discharge. No scleral icterus.  Neck: Normal range of motion. Neck supple. No thyromegaly present.  Cardiovascular: Normal rate, regular rhythm, normal heart sounds and intact distal pulses.  Exam reveals no gallop and no friction rub.   No murmur heard. Pulmonary/Chest: Effort normal and breath sounds normal. No stridor. No respiratory distress. She has no wheezes. She has no rales.  Abdominal: Soft. Bowel sounds are normal. She exhibits no distension. There is no tenderness. There is no rebound and no guarding.  Musculoskeletal: She exhibits no edema.  No midline cervical tenderness  Lymphadenopathy:    She has no cervical adenopathy.  Neurological: She is alert. Coordination normal.  CN 3-12 intact; normal sensation throughout; 5/5 strength in all 4 extremities; equal bilateral grip strength  Skin: Skin is warm and dry. No rash noted. She is not diaphoretic. No pallor.  Psychiatric: She has a normal mood and affect.  Nursing note and vitals reviewed.    ED Treatments / Results  DIAGNOSTIC STUDIES: Oxygen Saturation is 99% on RA, normal by my interpretation.    COORDINATION OF CARE: 2:23 PM Discussed treatment plan with pt at bedside and pt agreed to plan.  Labs (all labs ordered are listed, but only abnormal results are displayed) Labs Reviewed  POC URINE PREG, ED    EKG  EKG Interpretation None       Radiology Ct Head Wo Contrast  Result Date: 05/23/2017 CLINICAL DATA:  Right-sided headache for 4 days. EXAM: CT HEAD WITHOUT CONTRAST TECHNIQUE: Contiguous axial images were obtained from the base of the skull through the vertex without  intravenous contrast. COMPARISON:  05/04/2010 FINDINGS: Brain: There is no evidence for acute hemorrhage, hydrocephalus, mass lesion, or abnormal extra-axial fluid collection. No definite CT evidence for acute infarction. Vascular: No hyperdense vessel or unexpected calcification. Skull: No evidence for fracture. No worrisome lytic or sclerotic lesion. Sinuses/Orbits: The visualized paranasal sinuses and mastoid air cells are clear. Visualized portions of the globes and intraorbital fat are unremarkable. Other: None. IMPRESSION: 1. Normal CT exam of the brain. Electronically Signed   By: Kennith Center M.D.   On: 05/23/2017 15:12    Procedures Procedures (including critical care time)  Medications Ordered in ED Medications - No data to display   Initial Impression / Assessment and Plan / ED Course  I have reviewed the triage vital signs and the nursing notes.  Pertinent labs & imaging results that were available during my care of  the patient were reviewed by me and considered in my medical decision making (see chart for details).     Patient with mild right-sided headache. I feel the patient's symptoms are more related to migraine or tension type headache. Patient clearly very anxious about the etiology of the headache and requesting CT of the head. I discussed the risks versus benefits and after this discussion, patient stated that she would still feel better if it was completed. CT of the head was normal today. Advised patient to continue symptomatic treatment including ibuprofen or Tylenol as needed for the headache, as well as heating pad and stretching to her upper trapezius muscles, as patient states she feels tension to that area. Follow-up to neurology for further evaluation and treatment. Return precautions discussed. Patient understands and agrees with plan. Patient vitals stable throughout ED course and discharged in satisfactory condition. I discussed patient case with Dr. Dalene SeltzerSchlossman who  guided the patient's management and agrees with plan.   Final Clinical Impressions(s) / ED Diagnoses   Final diagnoses:  Nonintractable episodic headache, unspecified headache type    New Prescriptions Discharge Medication List as of 05/23/2017  3:26 PM    I personally performed the services described in this documentation, which was scribed in my presence. The recorded information has been reviewed and is accurate.    Emi HolesLaw, Sheritha Louis M, PA-C 05/23/17 1531    Alvira MondaySchlossman, Erin, MD 05/23/17 [phone]

## 2017-05-23 NOTE — Discharge Instructions (Signed)
Use a heating pad on your shoulders 3-4 times daily alternating 20 minutes on, 20 minutes off. Attempt stretches that we discussed 3-4 times daily. You can take ibuprofen or Tylenol as prescribed over-the-counter, as needed for your pain. Please follow-up with a neurologist for further evaluation and treatment of your headaches if they continue. Please return to the emergency department if you develop any new or worsening symptoms.

## 2017-06-03 ENCOUNTER — Encounter (HOSPITAL_COMMUNITY): Payer: Self-pay | Admitting: Emergency Medicine

## 2017-06-03 ENCOUNTER — Emergency Department (HOSPITAL_COMMUNITY)
Admission: EM | Admit: 2017-06-03 | Discharge: 2017-06-03 | Disposition: A | Discharge status: Home / Self Care | Payer: Medicaid Other | Attending: Emergency Medicine | Admitting: Emergency Medicine

## 2017-06-03 DIAGNOSIS — R51 Headache: Secondary | ICD-10-CM | POA: Diagnosis present

## 2017-06-03 DIAGNOSIS — Z79899 Other long term (current) drug therapy: Secondary | ICD-10-CM | POA: Insufficient documentation

## 2017-06-03 DIAGNOSIS — Z7722 Contact with and (suspected) exposure to environmental tobacco smoke (acute) (chronic): Secondary | ICD-10-CM | POA: Diagnosis not present

## 2017-06-03 DIAGNOSIS — R519 Headache, unspecified: Secondary | ICD-10-CM

## 2017-06-03 NOTE — ED Triage Notes (Signed)
Pt states that she has had a dull pain in her head x 1 week. States that she wants a referral to the neurologist because she had one before and didn't call in time. Alert and oriented.

## 2017-06-03 NOTE — Discharge Instructions (Signed)
It was my pleasure taking care of you today!   Tylenol or ibuprofen as needed for pain.  Please call the neurology clinic listed today to schedule follow-up appointment.  If you develop worsening headache, new fever, new neck stiffness, rash, weakness, numbness, trouble with your speech, trouble walking, new or worsening symptoms or any concerning symptoms, please return to the ED immediately.

## 2017-06-03 NOTE — ED Provider Notes (Signed)
[facility] DEPT Provider Note   CSN: [id number] Arrival date & time: 06/03/17  1133     History   Chief Complaint Chief Complaint  Patient presents with  . Headache    HPI Erika Briggs is a 21 y.o. female.  The history is provided by medical records and the patient. No language interpreter was used.  Headache   Pertinent negatives include no fever.   Erika Briggs is a 21 y.o. female  with a PMH of ADD who presents to the Emergency Department complaining of Persistent headache for the last 2 weeks. Patient describes achy pain unilaterally above the right eye. 2/10. Patient states that pain occurs when she is stressed, worried or when she starts thinking about why her headaches have been occurring. She takes Tylenol with adequate relief. No phonophobia, photophobia, mental status changes, neck pain, fevers, chills, numbness, tingling, weakness, slurred speech. She was seen on July 20 are she received a migraine cocktail without relief and was discharged home. She was subsequently seen on July 21 where CT head was obtained and negative. She was referred to neurology, however it appears her referral has lapsed. She called to schedule an appointment, but told that referral was no longer good. She came to the emergency Department today to seek another referral to neurologist.    Past Medical History:  Diagnosis Date  . ADD (attention deficit disorder)   . BMI 40.0-44.9, adult (HCC)   . Gestational hypertension 12/31/2016    Patient Active Problem List   Diagnosis Date Noted  . BMI 40.0-44.9, adult [facility](HCC)     Past Surgical History:  Procedure Laterality Date  . EYE SURGERY      OB History    Gravida Para Term Preterm AB Living   2 1 1  0 0 1   SAB TAB Ectopic Multiple Live Births   0 0 0 0 1       Home Medications    Prior to Admission medications   Medication Sig Start Date End Date Taking? Authorizing Provider  acetaminophen (TYLENOL) 500 MG tablet Take 1,000 mg  by mouth every 4 (four) hours as needed. For fever, body aches    [provider]  Etonogestrel (NEXPLANON [place]) Inject 1 each into the skin once.    [provider]  ferrous sulfate (FERROUSUL) 325 (65 FE) MG tablet Take 1 tablet (325 mg total) by mouth 2 (two) times daily. Patient not taking: Reported on 05/22/2017 12/09/16   Tereso NewcomerAnyanwu, Ugonna A, MD  ibuprofen (ADVIL,MOTRIN) 600 MG tablet Take 1 tablet (600 mg total) by mouth every 6 (six) hours. 01/04/17   Montez MoritaLawson, Marie D, CNM  Prenatal Vit-Fe Fumarate-FA (MULTIVITAMIN-PRENATAL) 27-0.8 MG TABS tablet Take 1 tablet by mouth daily at 12 noon.    [provider]  ranitidine (ZANTAC) 150 MG capsule Take 150 mg by mouth as needed for heartburn.    [provider]    Family History Family History  Problem Relation Age of Onset  . Heart failure Other   . Hypertension Mother   . ADD / ADHD Mother   . ADD / ADHD Sister   . ADD / ADHD Brother   . Diabetes Maternal Grandmother     Social History Social History  Substance Use Topics  . Smoking status: Passive Smoke Exposure - Never Smoker  . Smokeless tobacco: Never Used  . Alcohol use No     Allergies   Patient has no known allergies.   Review of Systems  Review of Systems  Constitutional: Negative for chills, fatigue and fever.  HENT: Negative for congestion and ear pain.   Eyes: Negative for photophobia, pain and visual disturbance.  Respiratory: Negative for cough.   Musculoskeletal: Negative for neck pain.  Neurological: Positive for headaches. Negative for dizziness, tremors, syncope, speech difficulty, weakness and numbness.     Physical Exam Updated Vital Signs BP (!) 147/100 (BP Location: Left Arm)   Pulse 73   Temp 98.8 F (37.1 C) (Oral)   Resp 18   LMP 06/03/2017 (Exact Date)   SpO2 100%   Breastfeeding? Unknown   Physical Exam  Constitutional: She is oriented to person, place, and time. She appears well-developed and  well-nourished. No distress.  HENT:  Head: Normocephalic and atraumatic.  Eyes: Pupils are equal, round, and reactive to light. EOM are normal.  Neck:  No midline tenderness. Full ROM without pain. No meningeal signs.  Cardiovascular: Normal rate, regular rhythm and normal heart sounds.   No murmur heard. Pulmonary/Chest: Effort normal and breath sounds normal. No respiratory distress.  Abdominal: Soft. She exhibits no distension. There is no tenderness.  Musculoskeletal: Normal range of motion.  Neurological: She is alert and oriented to person, place, and time.  Alert, oriented, thought content appropriate, able to give a coherent history. Speech is clear and goal oriented, able to follow commands.  Cranial Nerves:  II:  Peripheral visual fields grossly normal, pupils equal, round, reactive to light III, IV, VI: EOM intact bilaterally, ptosis not present V,VII: smile symmetric, eyes kept closed tightly against resistance, facial light touch sensation equal VIII: hearing grossly normal IX, X: symmetric soft palate movement, uvula elevates symmetrically  XI: bilateral shoulder shrug symmetric and strong XII: midline tongue extension 5/5 muscle strength in upper and lower extremities bilaterally including strong and equal grip strength and dorsiflexion/plantar flexion Sensory to light touch normal in all four extremities.  Normal finger-to-nose and rapid alternating movements; normal gait and balance. Negative romberg, no pronator drift.  Skin: Skin is warm and dry.  Nursing note and vitals reviewed.    ED Treatments / Results  Labs (all labs ordered are listed, but only abnormal results are displayed) Labs Reviewed - No data to display  EKG  EKG Interpretation None       Radiology No results found.  Procedures Procedures (including critical care time)  Medications Ordered in ED Medications - No data to display   Initial Impression / Assessment and Plan / ED Course    I have reviewed the triage vital signs and the nursing notes.  Pertinent labs & imaging results that were available during my care of the patient were reviewed by me and considered in my medical decision making (see chart for details).     Erika Briggs is a 21 y.o. female who presents to ED for headache. Seen on 7/20 and 7/21 for same. Normal head CT on 7/21. No focal neuro deficits on exam. She states that the headache itself does not bother her very much, but she has been very worried about why the headache is occurring in the first place. Sometimes thinking about why she is having headaches will make headache occur. She takes Tylenol with adequate relief of the headache. She came to the emergency department today to seek referral to neurologist which was provided. The patient denies any neurologic symptoms such as visual changes, focal numbness/weakness, balance problems, confusion, or speech difficulty to suggest a life-threatening intracranial process such as intracranial hemorrhage or mass.  The patient has no clotting risk factors thus venous sinus thrombosis is unlikely. No fevers, neck pain or nuchal rigidity to suggest meningitis. I feel that the patient is safe for discharge home at this time. I have reviewed return precautions including development of neurologic symptoms, confusion, lethargy, difficulty speaking, or new/worsening/concerning symptoms. All questions answered.   Final Clinical Impressions(s) / ED Diagnoses   Final diagnoses:  Bad headache    New Prescriptions New Prescriptions   No medications on file     Sylvain Hasten, Chase PicketJaime Pilcher, PA-C 06/03/17 1249    Geoffery Lyonselo, [place], MD 06/03/17 [phone]

## 2017-06-09 ENCOUNTER — Ambulatory Visit (INDEPENDENT_AMBULATORY_CARE_PROVIDER_SITE_OTHER): Payer: Medicaid Other | Admitting: Neurology

## 2017-06-09 ENCOUNTER — Encounter: Payer: Self-pay | Admitting: Neurology

## 2017-06-09 DIAGNOSIS — R51 Headache: Secondary | ICD-10-CM

## 2017-06-09 DIAGNOSIS — R519 Headache, unspecified: Secondary | ICD-10-CM | POA: Insufficient documentation

## 2017-06-09 NOTE — Progress Notes (Signed)
PATIENT: Erika Briggs DOB: October 02, 1996  Chief Complaint  Patient presents with  . NP   ED Referral  . Headaaches    R sides pain sometimes in back of head.  No nausea or light sensitivity.  Cold temps makes it worse.       HISTORICAL  Erika Briggs is a 21 years old right-handed female, seen in refer by emergency room for evaluation of right-sided headache, initial evaluation was June 09 2017.   She had induced vaginal delivery on January 02 2017 due to and a pregnancy hypertension, she still has mild elevated blood pressure, today's blood pressure is 126/88, heart rate of 75,  She only had occasional headache in the past, on May 21 2017, she had set onset right-sided pressure pain, lasting for a few minutes, there was no significant light noise sensitivity, but she became worry about her symptoms, was seen by emergency room, and her primary care physician, to figure out why, eventually had a CAT scan of the brain on May 23 2017 that was normal  Since the initial event, she has intermittent transient sharp radiating pain starting from right occipital region to right parietal right temporal region, the pain was short lasting, she also complains of mild bilateral shoulder tension pain,   I personally reviewed CT head without contrast on May 23 2017, that was normal   REVIEW OF SYSTEMS: Full 14 system review of systems performed and notable only for sleepiness, depression, anxiety  ALLERGIES: No Known Allergies  HOME MEDICATIONS: Current Outpatient Prescriptions  Medication Sig Dispense Refill  . acetaminophen (TYLENOL) 500 MG tablet Take 1,000 mg by mouth every 4 (four) hours as needed. For fever, body aches    . Etonogestrel (NEXPLANON [place]) Inject 1 each into the skin once.    . ferrous sulfate (FERROUSUL) 325 (65 FE) MG tablet Take 1 tablet (325 mg total) by mouth 2 (two) times daily. (Patient not taking: Reported on 05/22/2017) 60 tablet 1  . ibuprofen (ADVIL,MOTRIN) 600 MG  tablet Take 1 tablet (600 mg total) by mouth every 6 (six) hours. (Patient not taking: Reported on 06/09/2017) 30 tablet 0  . Prenatal Vit-Fe Fumarate-FA (MULTIVITAMIN-PRENATAL) 27-0.8 MG TABS tablet Take 1 tablet by mouth daily at 12 noon.    . ranitidine (ZANTAC) 150 MG capsule Take 150 mg by mouth as needed for heartburn.     No current facility-administered medications for this visit.     PAST MEDICAL HISTORY: Past Medical History:  Diagnosis Date  . ADD (attention deficit disorder)   . BMI 40.0-44.9, adult (HCC)   . Depression    with anxiety  . Gestational hypertension 12/31/2016    PAST SURGICAL HISTORY: Past Surgical History:  Procedure Laterality Date  . EYE SURGERY    . 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 Heart failure Other   . Hypertension Mother   . ADD / ADHD Mother   . ADD / ADHD Sister   . ADD / ADHD Brother   . Diabetes Maternal Grandmother     SOCIAL HISTORY:  Social History   Social History  . Marital status: Single    Spouse name: N/A  . Number of children: N/A  . Years of education: N/A   Occupational History  . Not on file.   Social History Main Topics  . Smoking status: Passive Smoke Exposure - Never  Smoker  . Smokeless tobacco: Never Used  . Alcohol use No  . Drug use: No  . Sexual activity: Yes    Birth control/ protection: None   Other Topics Concern  . Not on file   Social History Narrative   Live home with parents with 5 mo old Erika Briggs.   Caffeine rare.  Soda's 3 x week.  Education  HS     PHYSICAL EXAM   Vitals:   06/09/17 1337  BP: 126/88  Pulse: 75  Weight: 203 lb 8 oz (92.3 kg)  Height: 5\' 1"  (1.549 m)    Not recorded      Body mass index is 38.45 kg/m.  PHYSICAL EXAMNIATION:  Gen: NAD, conversant, well nourised, obese, well groomed                     Cardiovascular: Regular rate rhythm, no peripheral edema, warm,  nontender. Eyes: Conjunctivae clear without exudates or hemorrhage Neck: Supple, no carotid bruits. Pulmonary: Clear to auscultation bilaterally Musculoskeleton: tenderness at right occipital region with deep palpitation.   NEUROLOGICAL EXAM:  MENTAL STATUS: Speech:    Speech is normal; fluent and spontaneous with normal comprehension.  Cognition:     Orientation to time, place and person     Normal recent and remote memory     Normal Attention span and concentration     Normal Language, naming, repeating,spontaneous speech     Fund of knowledge   CRANIAL NERVES: CN II: Visual fields are full to confrontation. Fundoscopic exam is normal with sharp discs and no vascular changes. Pupils are round equal and briskly reactive to light. CN III, IV, VI: extraocular movement are normal. No ptosis. CN V: Facial sensation is intact to pinprick in all 3 divisions bilaterally. Corneal responses are intact.  CN VII: Face is symmetric with normal eye closure and smile. CN VIII: Hearing is normal to rubbing fingers CN IX, X: Palate elevates symmetrically. Phonation is normal. CN XI: Head turning and shoulder shrug are intact CN XII: Tongue is midline with normal movements and no atrophy.  MOTOR: There is no pronator drift of out-stretched arms. Muscle bulk and tone are normal. Muscle strength is normal.  REFLEXES: Reflexes are 2+ and symmetric at the biceps, triceps, knees, and ankles. Plantar responses are flexor.  SENSORY: Intact to light touch, pinprick, positional sensation and vibratory sensation are intact in fingers and toes.  COORDINATION: Rapid alternating movements and fine finger movements are intact. There is no dysmetria on finger-to-nose and heel-knee-shin.    GAIT/STANCE: Posture is normal. Gait is steady with normal steps, base, arm swing, and turning. Heel and toe walking are normal. Tandem gait is normal.  Romberg is absent.   DIAGNOSTIC DATA (LABS, IMAGING,  TESTING) - I reviewed patient records, labs, notes, testing and imaging myself where available.   ASSESSMENT AND PLAN  Erika Briggs is a 21 y.o. female    Chronic headaches   NSAIDs as needed  Neck stretching exercise   hot compression    Erika FeinsteinYijun Ellenora Briggs, M.D. Ph.D.  [facility] [address], [address] [place], [place] [address] Ph: [phone]) [phone] Fax: [phone]  CC: Referring Provider

## 2017-12-03 ENCOUNTER — Ambulatory Visit: Payer: Medicaid Other | Admitting: Obstetrics and Gynecology

## 2017-12-03 ENCOUNTER — Encounter: Payer: Self-pay | Admitting: Obstetrics and Gynecology

## 2017-12-03 VITALS — BP 115/77 | HR 75 | Wt 227.0 lb

## 2017-12-03 DIAGNOSIS — Z975 Presence of (intrauterine) contraceptive device: Secondary | ICD-10-CM

## 2017-12-03 NOTE — Progress Notes (Signed)
Obstetrics and Gynecology Visit Return Patient Evaluation  Appointment Date: 12/03/2017  Referring Provider: self  Chief Complaint: possible broken nexplanon  History of Present Illness:  Erika Briggs is a 22 y.o. G1P1 (LMP: amenorrhea). Pt would like to have nexplanon out because she thinks it's broken and because she isn't sexually active and would like it out. No intermenstrual bleeding.   Review of Systems:  as noted in the History of Present Illness. Medications and Allergies: reviewed  Physical Exam:  BP 115/77   Pulse 75   Wt 227 lb (103 kg)   BMI 42.89 kg/m  Body mass index is 42.89 kg/m. General appearance: Well nourished, well developed female in no acute distress.  Extremity, left: nexplanon palpated in the subcu area, nttp and doesn't feel broken   Assessment: pt doing well  Plan:  1. Nexplanon in place D/w her that even though she isn't sexually active, the nexplanon is working well, and that she doesn't need to have a period if she has one in. She states this isn't a concern but would like it out b/c she isn't sexually active. I told her to lets get an x ray and if it's broken then we have to take it out but if it's not, to take a few days to think about it and if she would like it out, we can take it out. - DG Humerus Left; Future   RTC: PRN  Erika Copaharlie Othniel Briggs, Jr MD Attending Center for [facility](Faculty Practice)

## 2018-04-16 ENCOUNTER — Encounter (HOSPITAL_COMMUNITY): Payer: Self-pay

## 2018-04-16 ENCOUNTER — Other Ambulatory Visit: Payer: Self-pay

## 2018-04-16 ENCOUNTER — Emergency Department (HOSPITAL_COMMUNITY)
Admission: EM | Admit: 2018-04-16 | Discharge: 2018-04-16 | Disposition: A | Discharge status: Home Health | Payer: Medicaid Other | Attending: Emergency Medicine | Admitting: Emergency Medicine

## 2018-04-16 DIAGNOSIS — Z79899 Other long term (current) drug therapy: Secondary | ICD-10-CM | POA: Diagnosis not present

## 2018-04-16 DIAGNOSIS — H60332 Swimmer's ear, left ear: Secondary | ICD-10-CM | POA: Insufficient documentation

## 2018-04-16 DIAGNOSIS — H9202 Otalgia, left ear: Secondary | ICD-10-CM | POA: Diagnosis present

## 2018-04-16 DIAGNOSIS — Z7722 Contact with and (suspected) exposure to environmental tobacco smoke (acute) (chronic): Secondary | ICD-10-CM | POA: Diagnosis not present

## 2018-04-16 MED ORDER — NEOMYCIN-POLYMYXIN-HC 3.5-10000-1 OT SUSP: 4.0000 [drp] | Freq: Four times a day (QID) | OTIC | 0 refills | Status: DC

## 2018-04-16 MED ORDER — MELOXICAM 15 MG PO TABS: 15.0000 mg | ORAL_TABLET | Freq: Every day | ORAL | 0 refills | Status: DC

## 2018-04-16 MED ORDER — IBUPROFEN 800 MG PO TABS
800.0000 mg | ORAL_TABLET | Freq: Once | ORAL | Status: AC
  Administered 2018-04-16: 800 mg via ORAL
  Filled 2018-04-16: qty 1

## 2018-04-16 NOTE — Discharge Instructions (Signed)
Contact a health care provider if: You have a fever. After 3 days your ear is still red, swollen, painful, or draining pus. Your redness, swelling, or pain gets worse. You have a severe headache. You have redness, swelling, pain, or tenderness in the area behind your ear.

## 2018-04-16 NOTE — ED Provider Notes (Signed)
[facility]-EMERGENCY DEPT Provider Note   CSN: [id number] Arrival date & time: 04/16/18  [id number]     History   Chief Complaint Chief Complaint  Patient presents with  . Otalgia    HPI Erika Briggs is a 22 y.o. female who presents the emergency department chief complaint of left ear pain.  Patient states that she began having pain that is been throbbing on and off for the past week however became severe and has been constant for the past 18 hours.  She denies drainage from the ear.  She was at the beach 3 weeks ago.  She denies fevers or chills.  She has not had a history of barotrauma or previous ear infections.  HPI  Past Medical History:  Diagnosis Date  . ADD (attention deficit disorder)   . BMI 40.0-44.9, adult (HCC)   . Depression    with anxiety  . Gestational hypertension 12/31/2016    Patient Active Problem List   Diagnosis Date Noted  . Right-sided headache 06/09/2017  . BMI 40.0-44.9, adult [facility](HCC)     Past Surgical History:  Procedure Laterality Date  . EYE SURGERY    . 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 Authorizing Provider  acetaminophen (TYLENOL) 500 MG tablet Take 1,000 mg by mouth every 4 (four) hours as needed. For fever, body aches    [provider]  Etonogestrel (NEXPLANON [place]) Inject 1 each into the skin once.    [provider]  ferrous sulfate (FERROUSUL) 325 (65 FE) MG tablet Take 1 tablet (325 mg total) by mouth 2 (two) times daily. Patient not taking: Reported on 05/22/2017 12/09/16   Tereso NewcomerAnyanwu, Ugonna A, MD  ibuprofen (ADVIL,MOTRIN) 600 MG tablet Take 1 tablet (600 mg total) by mouth every 6 (six) hours. Patient not  taking: Reported on 06/09/2017 01/04/17   Montez MoritaLawson, Marie D, CNM  Prenatal Vit-Fe Fumarate-FA (MULTIVITAMIN-PRENATAL) 27-0.8 MG TABS tablet Take 1 tablet by mouth daily at 12 noon.    [provider]  ranitidine (ZANTAC) 150 MG capsule Take 150 mg by mouth as needed for heartburn.    [provider]    Family History Family History  Problem Relation Age of Onset  . Heart failure Other   . Hypertension Mother   . ADD / ADHD Mother   . ADD / ADHD Sister   . ADD / ADHD Brother   . Diabetes Maternal Grandmother     Social History Social History   Tobacco Use  . Smoking status: Passive Smoke Exposure - Never Smoker  . Smokeless tobacco: Never Used  Substance Use Topics  . Alcohol use: No  . Drug use: No     Allergies   Patient has no known allergies.   Review of Systems Review of Systems Ten systems reviewed and are negative for acute change, except as noted in the HPI. \  Physical Exam Updated Vital Signs BP Marland Kitchen(!)  141/99 (BP Location: Left Arm)   Pulse 94   Temp 98.7 F (37.1 C) (Oral)   Resp 16   Ht 5\' 1"  (1.549 m)   Wt 99.8 kg (220 lb)   SpO2 100%   BMI 41.57 kg/m   Physical Exam Physical Exam  Nursing note and vitals reviewed. Constitutional: She is oriented to person, place, and time. She appears well-developed and well-nourished. No distress.  HENT:  Head: Normocephalic and atraumatic.  Eyes: Conjunctivae normal and EOM are normal. Pupils are equal, round, and reactive to light. No scleral icterus.  Ears: Left ear with tenderness to movement of the pinna, mild preauricular adenopathy.  Swelling of the canal noted with white discharge.  TM clear, no evidence of middle ear infection.  The contralateral ear examination is normal. Neck: Normal range of motion.  Cardiovascular: Normal rate, regular rhythm and normal heart sounds.  Exam reveals no gallop and no friction rub.   No murmur heard. Pulmonary/Chest: Effort normal and breath sounds normal. No  respiratory distress.  Abdominal: Soft. Bowel sounds are normal. She exhibits no distension and no mass. There is no tenderness. There is no guarding.  Neurological: She is alert and oriented to person, place, and time.  Skin: Skin is warm and dry. She is not diaphoretic.     ED Treatments / Results  Labs (all labs ordered are listed, but only abnormal results are displayed) Labs Reviewed - No data to display  EKG None  Radiology No results found.  Procedures Procedures (including critical care time)  Medications Ordered in ED Medications  ibuprofen (ADVIL,MOTRIN) tablet 800 mg (has no administration in time range)     Initial Impression / Assessment and Plan / ED Course  I have reviewed the triage vital signs and the nursing notes.  Pertinent labs & imaging results that were available during my care of the patient were reviewed by me and considered in my medical decision making (see chart for details).     Otitis externa  Pt presenting with otitis externa after swimming. No canal occlusion, Pt afebrile in NAD. Exam non concerning for mastoiditis, cellulitis or malignant OE.  Dc with ofloxacin script.  Advised ENT follow up in 2-3 days if no improvement with treatment or no complete resolution by 7 days. Earlier f-u if Patient develops rash , allergic reaction to medication, or loss of hearing.   Final Clinical Impressions(s) / ED Diagnoses   Final diagnoses:  None    ED Discharge Orders    None       Arthor Captain, PA-C 04/16/18 0900    Donnetta Hutching, MD 04/17/18 [phone]

## 2018-04-16 NOTE — ED Triage Notes (Signed)
Patient c/o left ear pain that started yesterday.  

## 2018-05-25 ENCOUNTER — Encounter

## 2018-08-04 IMAGING — CT CT HEAD W/O CM
3 of 4 series · 14 of 47 positions shown, 16 images · non-contrast
Comparison: 05/04/2010

CLINICAL DATA: Right-sided headache for 4 days.

EXAM:
CT HEAD WITHOUT CONTRAST
TECHNIQUE: Contiguous axial images were obtained from the base of the skull
through the vertex without intravenous contrast.

[Series 2: head w/o · axial · non-contrast · 0.47mm/px · z∈[+1439,+1559]mm · 8 of 30 slices shown, 10 images]
[im 3/30  brain]
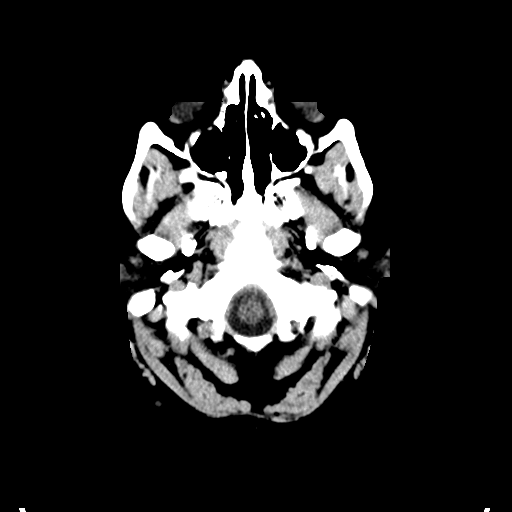
[im 3/30  bone]
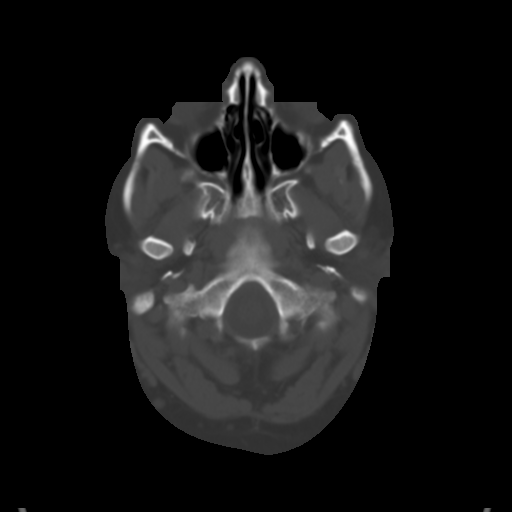
[im 7/30  brain]
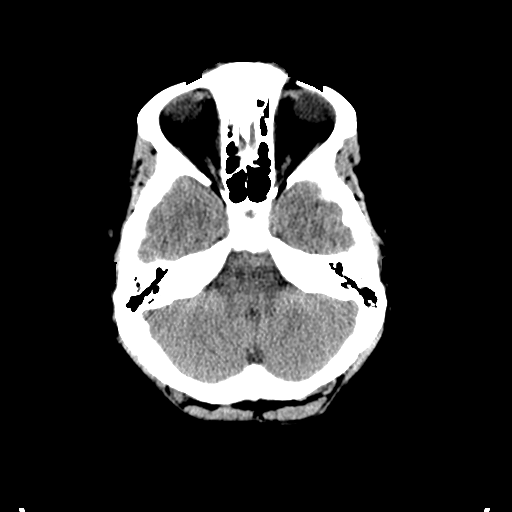
[im 11/30  brain]
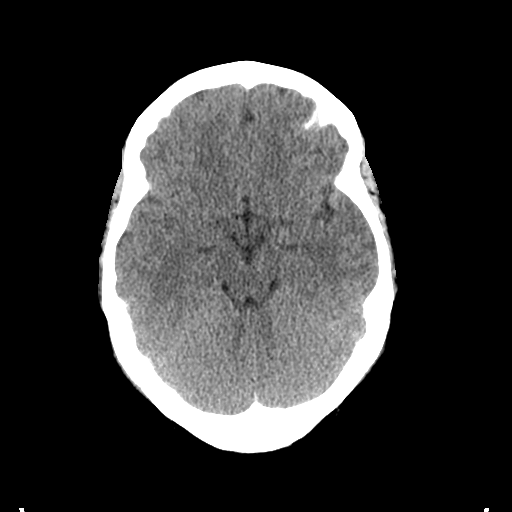
[im 13/30  brain]
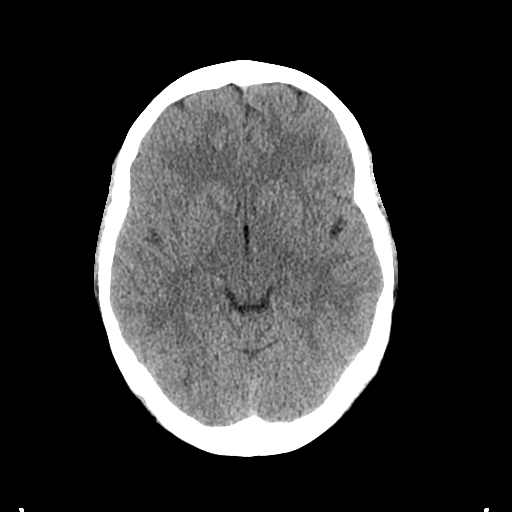
[im 17/30  brain]
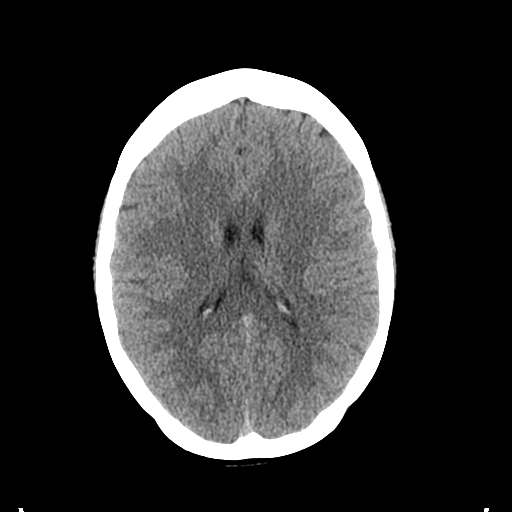
[im 17/30  bone]
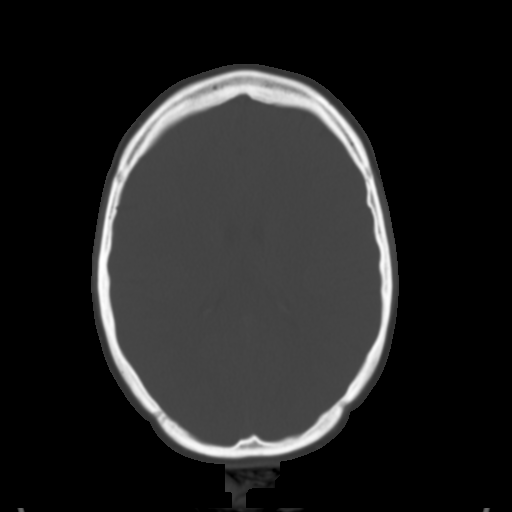
[im 19/30  brain]
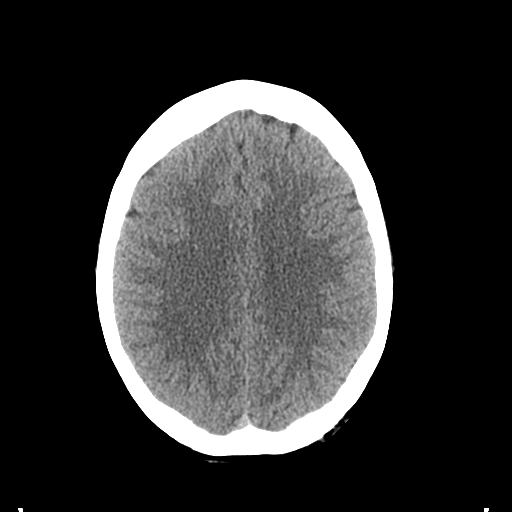
[im 23/30  brain]
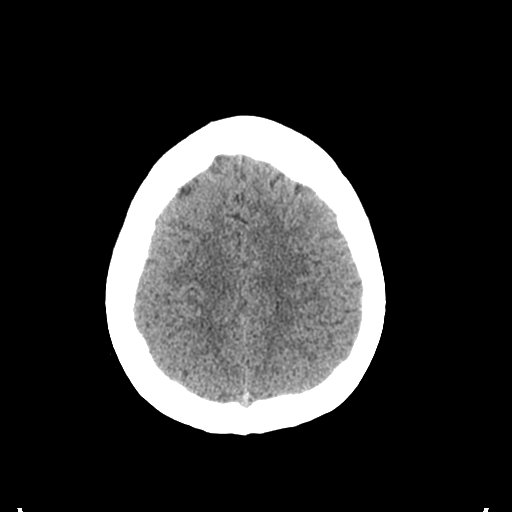
[im 27/30  brain]
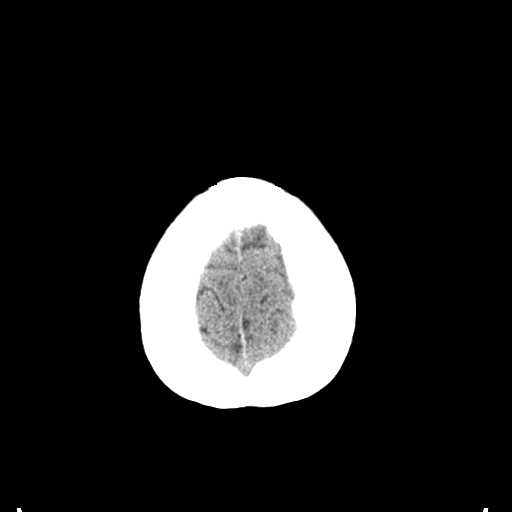

[Series 4: coronal · coronal · 0.29mm/px · 3 of 77 slices shown]
[im 26/77  brain]
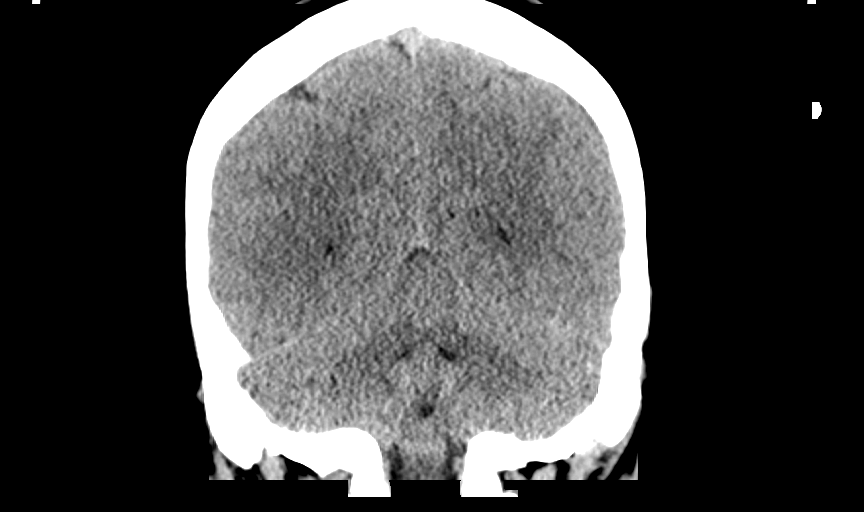
[im 34/77  brain]
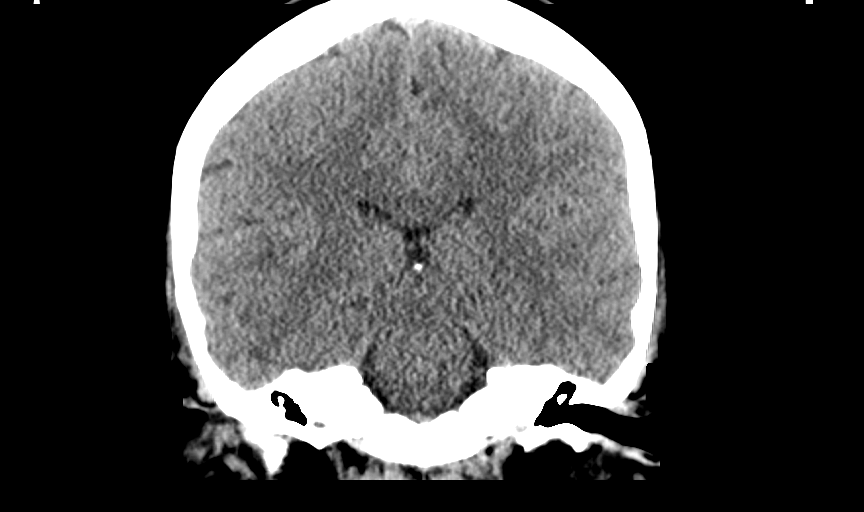
[im 43/77  brain]
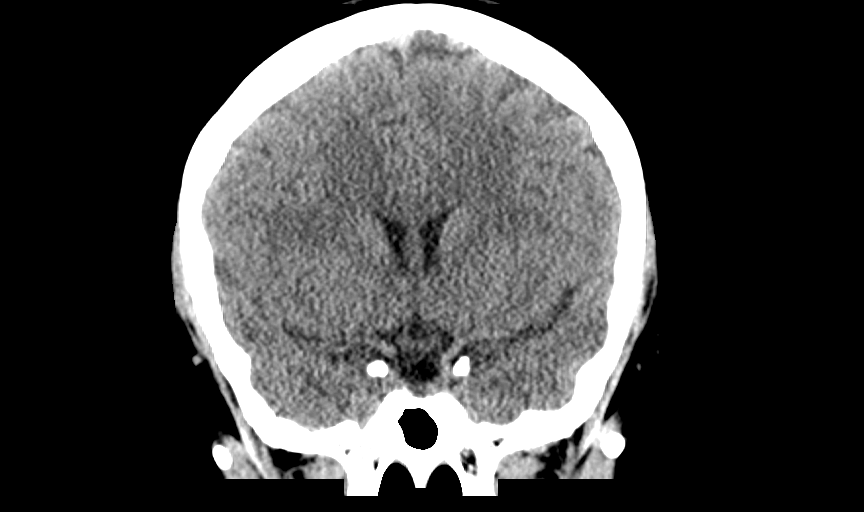

[Series 5: sagittal · sagittal · 0.29mm/px · 3 of 77 slices shown]
[im 26/77  brain]
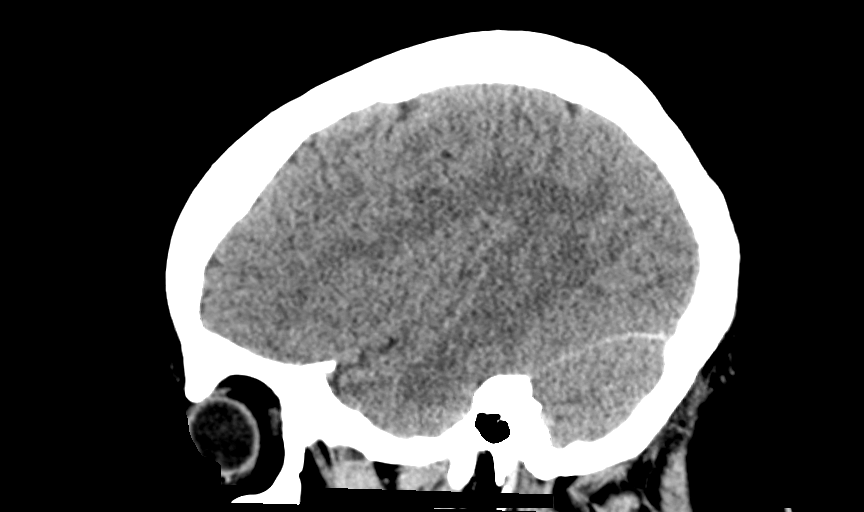
[im 39/77  brain]
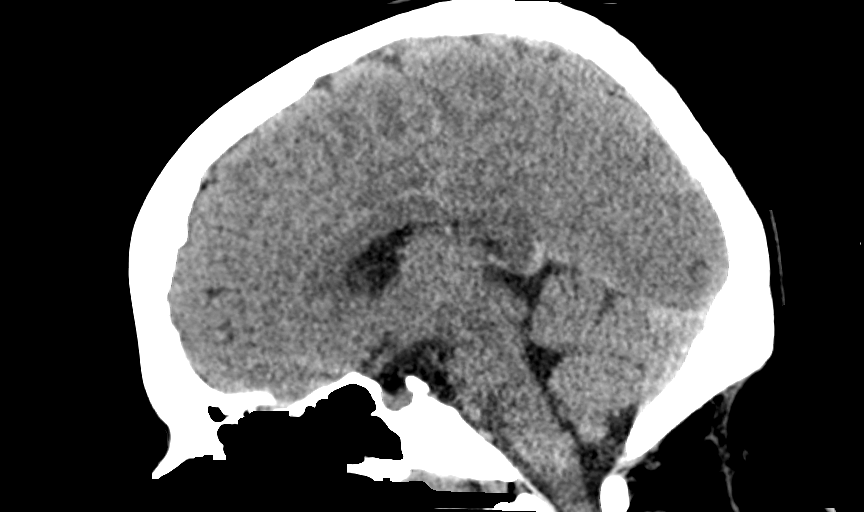
[im 51/77  brain]
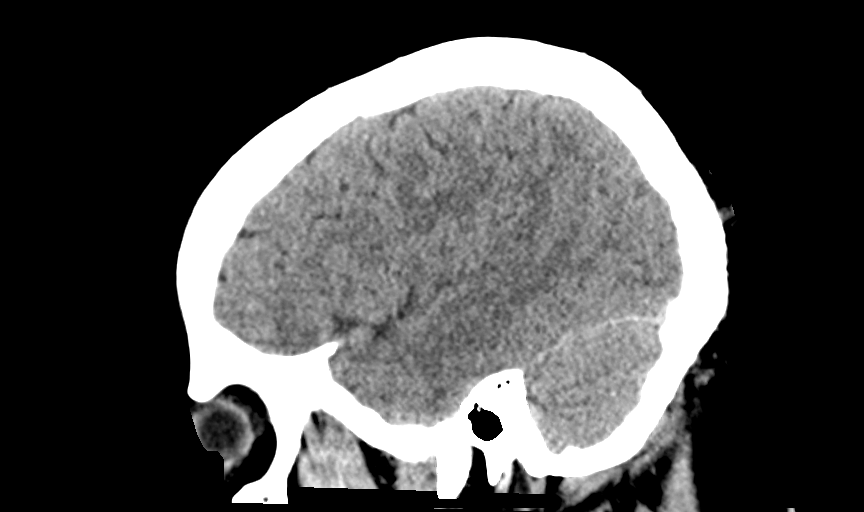

[14 of 47 positions shown; findings below may reference images not displayed]

FINDINGS: Brain: There is no evidence for acute hemorrhage, hydrocephalus,
mass lesion, or abnormal extra-axial fluid collection. No definite
CT evidence for acute infarction.

Vascular: No hyperdense vessel or unexpected calcification.

Skull: No evidence for fracture. No worrisome lytic or sclerotic
lesion.

Sinuses/Orbits: The visualized paranasal sinuses and mastoid air
cells are clear. Visualized portions of the globes and intraorbital
fat are unremarkable.

Other: None.
IMPRESSION: 1. Normal CT exam of the brain.

## 2020-12-05 ENCOUNTER — Ambulatory Visit: Payer: Medicaid Other | Admitting: Dietician

## 2021-01-14 ENCOUNTER — Ambulatory Visit: Payer: Medicaid Other | Admitting: Dietician

## 2021-02-25 ENCOUNTER — Encounter: Payer: Self-pay | Admitting: Dietician

## 2021-02-25 ENCOUNTER — Other Ambulatory Visit: Payer: Self-pay

## 2021-02-25 ENCOUNTER — Encounter: Payer: Medicaid Other | Attending: Family Medicine | Admitting: Dietician

## 2021-02-25 VITALS — Ht 61.0 in | Wt 250.4 lb

## 2021-02-25 DIAGNOSIS — Z833 Family history of diabetes mellitus: Secondary | ICD-10-CM | POA: Insufficient documentation

## 2021-02-25 DIAGNOSIS — E7800 Pure hypercholesterolemia, unspecified: Secondary | ICD-10-CM | POA: Diagnosis not present

## 2021-02-25 DIAGNOSIS — Z6841 Body Mass Index (BMI) 40.0 and over, adult: Secondary | ICD-10-CM | POA: Insufficient documentation

## 2021-02-25 NOTE — Progress Notes (Signed)
Medical Nutrition Therapy: Visit start time: 1445  end time: 1550  Assessment:  Diagnosis: obesity Past medical history: hypothyroidism, hyperlipidemia Psychosocial issues/ stress concerns: ADHD  Preferred learning method:  . Auditory . Hands-on   Current weight: 250.4lbs with shoes  Height: 5'1" Medications, supplements: reconciled list in medical record  Progress and evaluation:   Patient reports some general weight fluctuation between 250 and 260lbs in recent months. Her highest weight was 266lbs.   She has been working to eat less takeout food in effort to lose weight, active job, and adjustments to thyroid and ADHD meds have helped also.   No dieting history no special diets, other than some calorie counting.   Has considered meal prepping to have lunches ready to take to work.  Physical activity: no regular structured activity; does have active job full time  Dietary Intake:  Usual eating pattern includes 2 meals and 1 snacks per day. Dining out frequency: 6-8 meals per week.  Breakfast: none Snack: none Lunch: 11-12 -- has been takeout food (feels choices are the issue) lg fries, water or fruit punch. Does not eat much meat Snack: none Supper: 6-8pm -- noodles ie ramen or macaroni; sometimes just fruit Snack: popcorn or chips Beverages: water, lemonade, fruit punch, milk  Nutrition Care Education: Topics covered:  Basic nutrition: basic food groups, appropriate nutrient balance, appropriate meal and snack schedule, general nutrition guidelines    Weight control: importance of low sugar and low fat choices, portion control strategies using low carb vegetables to stretch starch portions; estimated energy needs for weight loss at 1500kcal, provided guidance for 50% CHO, 20% protein, and 30% fat; discussed effects of high carb pattern on blood sugar and circulating insulin on weight control, meal options for balanced eating without extensive cooking/ time/ expense Advanced  nutrition: cooking techniques ie meal prep  Nutritional Diagnosis:  [place]-3.3 Overweight/obesity As related to excess calories, history of inadequate physical activity, hypothyroidism.  As evidenced by patient with current BMI of 47.31.  Intervention:  . Instruction and discussion as noted above. . Patient has begun making positive diet and lifestyle changes, and is motivated to continue. . Established nutrition goals with direction from patient.  . No follow up scheduled at this time; patient will schedule later as needed.  Education Materials given:  . Plate Planner with food lists, sample meal pattern . Sample menus  Meal prepping handout . Visit summary with goals/ instructions   Learner/ who was taught:  . Patient   Level of understanding: Marland Kitchen Verbalizes/ demonstrates competency  Demonstrated degree of understanding via:   Teach back Learning barriers: . None  Willingness to learn/ readiness for change: . Eager, change in progress   Monitoring and Evaluation:  Dietary intake, exercise, and body weight      follow up: prn

## 2021-02-25 NOTE — Patient Instructions (Signed)
   Continue to work on eating more homemade meals.  Make sure to include a protein food with meals, adding a protein food and a low carb veggie can stretch starch portions and reduce cravings.

## 2023-05-11 ENCOUNTER — Emergency Department (HOSPITAL_COMMUNITY): Payer: Medicaid Other

## 2023-05-11 ENCOUNTER — Emergency Department (HOSPITAL_COMMUNITY)
Admission: EM | Admit: 2023-05-11 | Discharge: 2023-05-11 | Disposition: A | Discharge status: Home / Self Care | Payer: Medicaid Other | Attending: Emergency Medicine | Admitting: Emergency Medicine

## 2023-05-11 ENCOUNTER — Other Ambulatory Visit: Payer: Self-pay

## 2023-05-11 DIAGNOSIS — R079 Chest pain, unspecified: Secondary | ICD-10-CM | POA: Insufficient documentation

## 2023-05-11 LAB — COMPREHENSIVE METABOLIC PANEL
ALT: 27 U/L (ref 0–44)
AST: 20 U/L (ref 15–41)
Albumin: 3.6 g/dL (ref 3.5–5.0)
Alkaline Phosphatase: 87 U/L (ref 38–126)
Anion gap: 13 (ref 5–15)
BUN: 9 mg/dL (ref 6–20)
CO2: 20 mmol/L — ABNORMAL LOW (ref 22–32)
Calcium: 10.1 mg/dL (ref 8.9–10.3)
Chloride: 102 mmol/L (ref 98–111)
Creatinine, Ser: 0.68 mg/dL (ref 0.44–1.00)
GFR, Estimated: >60 mL/min (ref >60)
Glucose, Bld: 108 mg/dL — ABNORMAL HIGH (ref 70–99)
Potassium: 4.2 mmol/L (ref 3.5–5.1)
Sodium: 135 mmol/L (ref 135–145)
Total Bilirubin: 0.4 mg/dL (ref 0.3–1.2)
Total Protein: 7.9 g/dL (ref 6.5–8.1)

## 2023-05-11 LAB — CBC
HCT: 42.3 % (ref 36.0–46.0)
Hemoglobin: 13.4 g/dL (ref 12.0–15.0)
MCH: 26.5 pg (ref 26.0–34.0)
MCHC: 31.7 g/dL (ref 30.0–36.0)
MCV: 83.8 fL (ref 80.0–100.0)
Platelets: 435 10*3/uL — ABNORMAL HIGH (ref 150–400)
RBC: 5.05 MIL/uL (ref 3.87–5.11)
RDW: 13.4 % (ref 11.5–15.5)
WBC: 11.8 10*3/uL — ABNORMAL HIGH (ref 4.0–10.5)
nRBC: 0 % (ref 0.0–0.2)

## 2023-05-11 LAB — LIPASE, BLOOD: Lipase: 39 U/L (ref 11–51)

## 2023-05-11 LAB — TROPONIN I (HIGH SENSITIVITY): Troponin I (High Sensitivity): 7 ng/L (ref <18)

## 2023-05-11 MED ORDER — ALUM & MAG HYDROXIDE-SIMETH 200-200-20 MG/5ML PO SUSP
30.0000 mL | Freq: Once | ORAL | Status: AC
  Administered 2023-05-11: 30 mL via ORAL
  Filled 2023-05-11: qty 30

## 2023-05-11 MED ORDER — OMEPRAZOLE 20 MG PO CPDR: 20.0000 mg | DELAYED_RELEASE_CAPSULE | Freq: Every day | ORAL | 0 refills | Status: DC

## 2023-05-11 NOTE — ED Triage Notes (Signed)
Pt to the ed form home with a CC of chest pain x indigestion x 2 days. Pt took tums and pepto yesterday with relief. Pt relay she woke up this morning with the same chest pain and took the same meds without relief. Pt denies dizziness, sob, loc, or any other s/s at this time.

## 2023-05-11 NOTE — Discharge Instructions (Signed)
I think you likely have either reflux or esophagitis.  Workup was reassuring overall.  Take the Prilosec to help with the symptoms.  Follow-up with GI if symptoms do not improve.

## 2023-05-11 NOTE — ED Provider Notes (Signed)
[place] EMERGENCY DEPARTMENT AT [facility] Provider Note   CSN: [id number] Arrival date & time: 05/11/23  4098     History  Chief Complaint  Patient presents with   Abdominal Pain    Erika Briggs is a 27 y.o. female.   Abdominal Pain Patient presents with chest pain.  Has had for 2 days.  Woke with it yesterday.  Took Pepto and Tums with relief.  Did not eat much yesterday.  States this morning she woke up with same pain.  Worse with laying back.  No shortness of breath.  No nausea or vomiting.  No abdominal pain.  No fevers.  Denies possibility pregnancy.  States that severe this morning.  No difficulty breathing.  Denies drug use.     Home Medications Prior to Admission medications   Medication Sig Start Date End Date Taking? Authorizing Provider  omeprazole (PRILOSEC) 20 MG capsule Take 1 capsule (20 mg total) by mouth daily. 05/11/23  Yes Benjiman Core, MD  acetaminophen (TYLENOL) 500 MG tablet Take 1,000 mg by mouth every 4 (four) hours as needed. For fever, body aches    [provider]  CONCERTA 27 MG CR tablet Take 27 mg by mouth daily. 01/17/21   [provider]  Etonogestrel (NEXPLANON [place]) Inject 1 each into the skin once.    [provider]  ferrous sulfate (FERROUSUL) 325 (65 FE) MG tablet Take 1 tablet (325 mg total) by mouth 2 (two) times daily. Patient not taking: No sig reported 12/09/16   Anyanwu, Jethro Bastos, MD  ibuprofen (ADVIL,MOTRIN) 600 MG tablet Take 1 tablet (600 mg total) by mouth every 6 (six) hours. Patient not taking: No sig reported 01/04/17   Montez Morita, CNM  levothyroxine (SYNTHROID) 125 MCG tablet Take 125 mcg by mouth daily. 02/22/21   [provider]      Allergies    Patient has no known allergies.    Review of Systems   Review of Systems  Gastrointestinal:  Positive for abdominal pain.    Physical Exam Updated Vital Signs BP 134/75 (BP Location: Right Arm)   Pulse 80   Temp 98.5 F  (36.9 C) (Oral)   Resp 16   Ht 5\' 1"  (1.549 m)   Wt 113 kg   SpO2 98%   BMI 47.07 kg/m  Physical Exam Constitutional:      Appearance: She is obese.  Cardiovascular:     Rate and Rhythm: Regular rhythm.  Pulmonary:     Breath sounds: Normal breath sounds.  Abdominal:     Tenderness: There is no abdominal tenderness.     ED Results / Procedures / Treatments   Labs (all labs ordered are listed, but only abnormal results are displayed) Labs Reviewed  COMPREHENSIVE METABOLIC PANEL - Abnormal; Notable for the following components:      Result Value   CO2 20 (*)    Glucose, Bld 108 (*)    All other components within normal limits  CBC - Abnormal; Notable for the following components:   WBC 11.8 (*)    Platelets 435 (*)    All other components within normal limits  LIPASE, BLOOD  TROPONIN I (HIGH SENSITIVITY)  TROPONIN I (HIGH SENSITIVITY)    EKG EKG Interpretation Date/Time:  Monday May 11 2023 08:13:47 EDT Ventricular Rate:  84 PR Interval:  166 QRS Duration:  82 QT Interval:  332 QTC Calculation: 392 R Axis:   5  Text Interpretation: Normal sinus rhythm  Normal ECG When compared with ECG of 28-Aug-2015 00:06, No significant change since last tracing Confirmed by Benjiman Core [id number]) on 05/11/2023 9:41:17 AM  Radiology DG Chest 2 View  Result Date: 05/11/2023 CLINICAL DATA:  chest pain EXAM: CHEST - 2 VIEW COMPARISON:  CXR 12/12/11 FINDINGS: The heart size and mediastinal contours are within normal limits. Both lungs are clear. The visualized skeletal structures are unremarkable. IMPRESSION: No focal airspace opacity Electronically Signed   By: Lorenza Cambridge M.D.   On: 05/11/2023 08:26    Procedures Procedures    Medications Ordered in ED Medications  alum & mag hydroxide-simeth (MAALOX/MYLANTA) 200-200-20 MG/5ML suspension 30 mL (30 mLs Oral Given 05/11/23 0820)    ED Course/ Medical Decision Making/ A&P                             Medical Decision  Making Amount and/or Complexity of Data Reviewed Labs: ordered. Radiology: ordered.  Risk OTC drugs. Prescription drug management.   Patient with chest pain.  Has had since yesterday.  Has somewhat waxed and wanes.  Worse with lying back.  Had some improvement with antiacids yesterday.  No abdominal tenderness.  Differential diagnosis includes reflux, pneumonia, esophagitis.  Also does somewhat include cardiac causes.  She does have a BMI of 47 puts you at high risk for cardiac events.  Will check basic blood work to evaluate for referred pain such as biliary disease.  Will give Maalox.  Will get EKG and x-ray. Workup reassuring.  Blood work reassuring.  Feels somewhat better compared to before.  Will start Prilosec.  I think most likely esophagitis.  Biliary disease felt less likely.  Doubt cardiac cause.  Will do GI follow-up if symptoms do not improve.        Final Clinical Impression(s) / ED Diagnoses Final diagnoses:  Nonspecific chest pain    Rx / DC Orders ED Discharge Orders          Ordered    omeprazole (PRILOSEC) 20 MG capsule  Daily        05/11/23 1015              Benjiman Core, MD 05/11/23 1015

## 2023-09-02 ENCOUNTER — Other Ambulatory Visit: Payer: Self-pay

## 2023-09-02 ENCOUNTER — Emergency Department
Admission: EM | Admit: 2023-09-02 | Discharge: 2023-09-02 | Disposition: A | Discharge status: Home / Self Care | Payer: Medicaid Other | Attending: Emergency Medicine | Admitting: Emergency Medicine

## 2023-09-02 ENCOUNTER — Emergency Department: Payer: Medicaid Other

## 2023-09-02 DIAGNOSIS — J181 Lobar pneumonia, unspecified organism: Secondary | ICD-10-CM | POA: Insufficient documentation

## 2023-09-02 DIAGNOSIS — J189 Pneumonia, unspecified organism: Secondary | ICD-10-CM

## 2023-09-02 DIAGNOSIS — R0602 Shortness of breath: Secondary | ICD-10-CM | POA: Diagnosis present

## 2023-09-02 DIAGNOSIS — Z20822 Contact with and (suspected) exposure to covid-19: Secondary | ICD-10-CM | POA: Insufficient documentation

## 2023-09-02 LAB — RESP PANEL BY RT-PCR (RSV, FLU A&B, COVID)  RVPGX2
Influenza A by PCR: NEGATIVE
Influenza B by PCR: NEGATIVE
Resp Syncytial Virus by PCR: NEGATIVE
SARS Coronavirus 2 by RT PCR: NEGATIVE

## 2023-09-02 MED ORDER — ALBUTEROL SULFATE (2.5 MG/3ML) 0.083% IN NEBU: 3.0000 mL | INHALATION_SOLUTION | RESPIRATORY_TRACT | Status: DC | PRN

## 2023-09-02 MED ORDER — AMOXICILLIN 500 MG PO CAPS: 1000.0000 mg | ORAL_CAPSULE | Freq: Three times a day (TID) | ORAL | 0 refills | Status: AC

## 2023-09-02 MED ORDER — AZITHROMYCIN 250 MG PO TABS: ORAL_TABLET | ORAL | 0 refills | Status: AC

## 2023-09-02 MED ORDER — AZITHROMYCIN 500 MG PO TABS
500.0000 mg | ORAL_TABLET | Freq: Once | ORAL | Status: AC
  Administered 2023-09-02: 500 mg via ORAL
  Filled 2023-09-02: qty 1

## 2023-09-02 MED ORDER — AMOXICILLIN 500 MG PO CAPS
1000.0000 mg | ORAL_CAPSULE | Freq: Once | ORAL | Status: AC
  Administered 2023-09-02: 1000 mg via ORAL
  Filled 2023-09-02: qty 2

## 2023-09-02 MED ORDER — IPRATROPIUM-ALBUTEROL 0.5-2.5 (3) MG/3ML IN SOLN
3.0000 mL | Freq: Once | RESPIRATORY_TRACT | Status: AC
  Administered 2023-09-02: 3 mL via RESPIRATORY_TRACT
  Filled 2023-09-02: qty 3

## 2023-09-02 NOTE — ED Provider Notes (Signed)
[facility] Provider Note    Event Date/Time   First MD Initiated Contact with Patient 09/02/23 [phone]     (approximate)   History   Cough, shortness of breath   HPI  Erika Briggs is a 27 y.o. female who presents with complaints of cough, shortness of breath, chills which developed about 4 days ago.  She report symptoms have continued, she reports productive cough.     Physical Exam   Triage Vital Signs: ED Triage Vitals  Encounter Vitals Group     BP 09/02/23 0858 (!) 143/109     Systolic BP Percentile --      Diastolic BP Percentile --      Pulse Rate 09/02/23 0857 89     Resp 09/02/23 0858 19     Temp 09/02/23 0857 98.9 F (37.2 C)     Temp src --      SpO2 09/02/23 0858 96 %     Weight 09/02/23 0859 127.5 kg (281 lb)     Height 09/02/23 0859 1.549 m (5\' 1" )     Head Circumference --      Peak Flow --      Pain Score --      Pain Loc --      Pain Education --      Exclude from Growth Chart --     Most recent vital signs: Vitals:   09/02/23 0857 09/02/23 0858  BP:  (!) 143/109  Pulse: 89   Resp:  19  Temp: 98.9 F (37.2 C)   SpO2:  96%     General: Awake, no distress.  CV:  Good peripheral perfusion.  Resp:  Normal effort.  Scattered wheezes, end expiratory Abd:  No distention.  Other:     ED Results / Procedures / Treatments   Labs (all labs ordered are listed, but only abnormal results are displayed) Labs Reviewed  RESP PANEL BY RT-PCR (RSV, FLU A&B, COVID)  [id number]     EKG  ED ECG REPORT I, Jene Every, the attending physician, personally viewed and interpreted this ECG.  Date: 09/02/2023  Rhythm: normal sinus rhythm QRS Axis: normal Intervals: normal ST/T Wave abnormalities: normal Narrative Interpretation: no evidence of acute ischemia    RADIOLOGY Chest x-ray viewed interpret by me, consistent with left lower lobe pneumonia    PROCEDURES:  Critical Care performed:    Procedures   MEDICATIONS ORDERED IN ED: Medications  ipratropium-albuterol (DUONEB) 0.5-2.5 (3) MG/3ML nebulizer solution 3 mL (3 mLs Nebulization Given 09/02/23 1003)  ipratropium-albuterol (DUONEB) 0.5-2.5 (3) MG/3ML nebulizer solution 3 mL (3 mLs Nebulization Given 09/02/23 1003)  amoxicillin (AMOXIL) capsule 1,000 mg (1,000 mg Oral Given 09/02/23 1002)  azithromycin (ZITHROMAX) tablet 500 mg (500 mg Oral Given 09/02/23 1002)     IMPRESSION / MDM / ASSESSMENT AND PLAN / ED COURSE  I reviewed the triage vital signs and the nursing notes. Patient's presentation is most consistent with acute illness / injury with system symptoms.  Patient presents with chills, cough, now with mild shortness of breath.  Differential includes pneumonia, upper respiratory infection, bronchospasm/bronchitis  Scattered end expiratory wheezing on exam, will treat with DuoNebs, COVID influenza PCR negative  X-ray consistent with left lower lobe pneumonia, will treat with amoxicillin, azithromycin  No indication for admission at this time, appropriate for outpatient treatment, return precautions discussed, patient agrees with this plan.       FINAL CLINICAL IMPRESSION(S) / ED DIAGNOSES   Final diagnoses:  Community acquired pneumonia of left lower lobe of lung     Rx / DC Orders   ED Discharge Orders          Ordered    amoxicillin (AMOXIL) 500 MG capsule  3 times daily        09/02/23 1004    azithromycin (ZITHROMAX Z-PAK) 250 MG tablet        09/02/23 1004             Note:  This document was prepared using Dragon voice recognition software and may include unintentional dictation errors.   Jene Every, MD 09/02/23 [phone]

## 2023-09-02 NOTE — ED Triage Notes (Addendum)
Pt arrives via POV. Pt reports chills, fever, cough, congestion, and decreased appetite since Friday. Pt also reports sob with exertion. PT AxOx4. NAD. Pt reports the cough is productive.

## 2023-09-02 NOTE — ED Notes (Signed)
See triage notes. Patient c/o fever, cough, decreased appetite and fatigue times five days.

## 2023-10-30 ENCOUNTER — Emergency Department
Admission: EM | Admit: 2023-10-30 | Discharge: 2023-10-30 | Disposition: A | Discharge status: Home / Self Care | Payer: Medicaid Other | Attending: Emergency Medicine | Admitting: Emergency Medicine

## 2023-10-30 ENCOUNTER — Other Ambulatory Visit: Payer: Self-pay

## 2023-10-30 ENCOUNTER — Emergency Department: Payer: Medicaid Other

## 2023-10-30 ENCOUNTER — Encounter: Payer: Self-pay | Admitting: Emergency Medicine

## 2023-10-30 DIAGNOSIS — S199XXA Unspecified injury of neck, initial encounter: Secondary | ICD-10-CM | POA: Diagnosis present

## 2023-10-30 DIAGNOSIS — Y92410 Unspecified street and highway as the place of occurrence of the external cause: Secondary | ICD-10-CM | POA: Diagnosis not present

## 2023-10-30 DIAGNOSIS — S161XXA Strain of muscle, fascia and tendon at neck level, initial encounter: Secondary | ICD-10-CM | POA: Insufficient documentation

## 2023-10-30 DIAGNOSIS — S39012A Strain of muscle, fascia and tendon of lower back, initial encounter: Secondary | ICD-10-CM | POA: Diagnosis not present

## 2023-10-30 DIAGNOSIS — T148XXA Other injury of unspecified body region, initial encounter: Secondary | ICD-10-CM

## 2023-10-30 LAB — POC URINE PREG, ED: Preg Test, Ur: NEGATIVE
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
## 2023-10-30 NOTE — Discharge Instructions (Signed)
You can take Tylenol and Motrin as needed to help with pain symptoms.  Please follow-up with your PCP as needed.
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
## 2024-02-16 ENCOUNTER — Other Ambulatory Visit (HOSPITAL_COMMUNITY): Payer: Self-pay

## 2024-02-16 MED ORDER — LEVOTHYROXINE SODIUM 125 MCG PO TABS
125.0000 ug | ORAL_TABLET | Freq: Every day | ORAL | 0 refills | Status: DC
  Filled 2024-02-16 – 2024-03-10 (×3): qty 30, 30d supply, fill #0

## 2024-02-16 MED ORDER — WEGOVY 0.25 MG/0.5ML ~~LOC~~ SOAJ
0.2500 mg | SUBCUTANEOUS | 0 refills | Status: DC
  Filled 2024-02-16 – 2024-03-10 (×2): qty 2, 28d supply, fill #0

## 2024-02-17 ENCOUNTER — Other Ambulatory Visit (HOSPITAL_COMMUNITY): Payer: Self-pay

## 2024-02-27 ENCOUNTER — Other Ambulatory Visit (HOSPITAL_COMMUNITY): Payer: Self-pay

## 2024-03-10 ENCOUNTER — Other Ambulatory Visit (HOSPITAL_COMMUNITY): Payer: Self-pay

## 2024-11-14 ENCOUNTER — Ambulatory Visit: Payer: Self-pay

## 2024-11-14 VITALS — BP 120/80 | HR 84 | Temp 97.9°F | Ht 61.25 in | Wt 254.0 lb

## 2024-11-14 DIAGNOSIS — F339 Major depressive disorder, recurrent, unspecified: Secondary | ICD-10-CM | POA: Insufficient documentation

## 2024-11-14 DIAGNOSIS — E611 Iron deficiency: Secondary | ICD-10-CM

## 2024-11-14 DIAGNOSIS — E559 Vitamin D deficiency, unspecified: Secondary | ICD-10-CM

## 2024-11-14 DIAGNOSIS — E039 Hypothyroidism, unspecified: Secondary | ICD-10-CM | POA: Insufficient documentation

## 2024-11-14 DIAGNOSIS — F900 Attention-deficit hyperactivity disorder, predominantly inattentive type: Secondary | ICD-10-CM | POA: Insufficient documentation

## 2024-11-14 DIAGNOSIS — F419 Anxiety disorder, unspecified: Secondary | ICD-10-CM | POA: Insufficient documentation

## 2024-11-14 DIAGNOSIS — E038 Other specified hypothyroidism: Secondary | ICD-10-CM

## 2024-11-14 MED ORDER — METHYLPHENIDATE HCL ER (OSM) 27 MG PO TBCR: 27.0000 mg | EXTENDED_RELEASE_TABLET | ORAL | 0 refills | Status: AC

## 2024-11-14 MED ORDER — LEVOTHYROXINE SODIUM 125 MCG PO TABS: 125.0000 ug | ORAL_TABLET | Freq: Every day | ORAL | 0 refills | Status: AC

## 2024-11-14 NOTE — Patient Instructions (Signed)
 Thank you for visiting [facility] today! Here's what we talked about: - START Concerta   - START taking synthroid  on an empty stomach, an hour before food and other meds

## 2024-11-14 NOTE — Progress Notes (Signed)
 "  Subjective:   This visit was conducted in person. The patient gave informed consent to the use of [employer] AI technology to record the contents of the encounter as documented below.   Patient ID: Erika Briggs, female    DOB: 09-08-1996, 29 y.o.   MRN: [id number]   Erika Briggs is a very pleasant 29 y.o. female who presents today as a new patient.  Past medical, surgical and family history: Reviewed and updated in chart.  Allergies: Reviewed and updated in chart.  Medications: Reviewed and updated in chart.  Social history: Reviewed and updated in chart.  Last PCP and reason for leaving: Seeking new provider  Discussed the use of AI scribe software for clinical note transcription with the patient, who gave verbal consent to proceed.  History of Present Illness Patient states she was diagnosed with ADHD in her childhood, has been on Vyvanse, but feels it is too strong, does not like how it makes her feel, she says she was on Concerta  27 mg daily in the past and felt better on it, she would like to go back to this.  She says she is taking her Synthroid  every day, but not separately from food or medications. Would like refills on her levothyroxine . Says she is sure she had a physical in the summer 2025 at some point. No acute complaints today.    Review of Systems  All other systems reviewed and are negative.       BP 120/80 (BP Location: Left Arm, Patient Position: Sitting, Cuff Size: Large)   Pulse 84   Temp 97.9 F (36.6 C) (Oral)   Ht 5' 1.25 (1.556 m)   Wt 254 lb (115.2 kg)   LMP 10/17/2024 (Approximate)   SpO2 99%   Breastfeeding No   BMI 47.60 kg/m   Objective:   Physical Exam Vitals and nursing note reviewed.  Constitutional:      Appearance: Normal appearance.  HENT:     Head: Normocephalic and atraumatic.  Eyes:     Extraocular Movements: Extraocular movements intact.     Conjunctiva/sclera: Conjunctivae normal.  Skin:    General: Skin is warm.   Neurological:     Mental Status: She is alert.  Psychiatric:        Mood and Affect: Mood normal.        Behavior: Behavior normal.            Assessment & Plan:   New patient, past medical and social history thoroughly reviewed and updated in chart.   Assessment & Plan 1. ADHD (attention deficit hyperactivity disorder), inattentive type (Primary) Requesting to retrial Concerta  as this has worked in the past, ordered below, discontinued Vyvanse.  Updated controlled substance use agreement, ordered UDS. Plan to follow-up in 2 weeks, can uptitrate Concerta  based on inattentive symptoms. - methylphenidate  (CONCERTA ) 27 MG PO CR tablet; Take 1 tablet (27 mg total) by mouth every morning.  Dispense: 30 tablet; Refill: 0 - DRUG MONITORING, PANEL 8 WITH CONFIRMATION, URINE  2. Other specified hypothyroidism Has not been dosing Synthroid  correctly, counseled her to take it on an empty stomach, an hour before food or any other medications, will continue at current dosage below and repeat TSH in 8 weeks.  - levothyroxine  (SYNTHROID ) 125 MCG tablet; Take 1 tablet (125 mcg total) by mouth daily.  Dispense: 90 tablet; Refill: 0 -Will order TSH in 8 weeks.   3. Vitamin D deficiency History of deficiency, will order levels - VITAMIN  D 25 Hydroxy (Vit-D Deficiency, Fractures); Future  4. Iron deficiency History of deficiency will order levels - Iron, TIBC and Ferritin Panel; Future        Return in about 2 weeks (around 11/28/2024) for ADHD, needs fasting labs in 2 week then also 06/17/2025 for CPE.   Juliano Mceachin K Kynsley Whitehouse, MD  11/14/2024    Contains text generated by [employer].    "

## 2024-11-16 LAB — DRUG MONITORING, PANEL 8 WITH CONFIRMATION, URINE
6 Acetylmorphine: NEGATIVE ng/mL
Alcohol Metabolites: NEGATIVE ng/mL
Amphetamines: NEGATIVE ng/mL
Benzodiazepines: NEGATIVE ng/mL
Buprenorphine, Urine: NEGATIVE ng/mL
Cocaine Metabolite: NEGATIVE ng/mL
Creatinine: 121.9 mg/dL
MDMA: NEGATIVE ng/mL
Marijuana Metabolite: NEGATIVE ng/mL
Opiates: NEGATIVE ng/mL
Oxidant: NEGATIVE ug/mL
Oxycodone: NEGATIVE ng/mL
pH: 5.5 (ref 4.5–9.0)

## 2024-11-16 LAB — DM TEMPLATE

## 2024-11-20 ENCOUNTER — Ambulatory Visit: Payer: Self-pay

## 2024-11-20 DIAGNOSIS — E559 Vitamin D deficiency, unspecified: Secondary | ICD-10-CM

## 2024-11-28 ENCOUNTER — Other Ambulatory Visit

## 2024-12-02 ENCOUNTER — Other Ambulatory Visit (INDEPENDENT_AMBULATORY_CARE_PROVIDER_SITE_OTHER)

## 2024-12-02 DIAGNOSIS — E611 Iron deficiency: Secondary | ICD-10-CM

## 2024-12-02 DIAGNOSIS — E559 Vitamin D deficiency, unspecified: Secondary | ICD-10-CM

## 2024-12-02 LAB — IBC + FERRITIN
Ferritin: 25.1 ng/mL (ref 10.0–291.0)
Iron: 106 ug/dL (ref 42–145)
Saturation Ratios: 27.4 % (ref 20.0–50.0)
TIBC: 386.4 ug/dL (ref 250.0–450.0)
Transferrin: 276 mg/dL (ref 212.0–360.0)

## 2024-12-02 LAB — VITAMIN D 25 HYDROXY (VIT D DEFICIENCY, FRACTURES): VITD: 11.22 ng/mL — ABNORMAL LOW (ref 30.00–100.00)

## 2024-12-04 DIAGNOSIS — E559 Vitamin D deficiency, unspecified: Secondary | ICD-10-CM | POA: Insufficient documentation

## 2024-12-04 MED ORDER — VITAMIN D (ERGOCALCIFEROL) 1.25 MG (50000 UNIT) PO CAPS: 50000.0000 [IU] | ORAL_CAPSULE | ORAL | 0 refills | Status: AC

## 2024-12-13 ENCOUNTER — Ambulatory Visit
